# Patient Record
Sex: Female | Born: 1985 | Race: Black or African American | Hispanic: No | Marital: Single | Smoking: Never smoker
Health system: Southern US, Community
[De-identification: ages and names within clinical notes are randomized; demographics above are authoritative.]

## PROBLEM LIST (undated history)

## (undated) DIAGNOSIS — Z789 Other specified health status: Secondary | ICD-10-CM

## (undated) HISTORY — PX: NO PAST SURGERIES: SHX2092

## (undated) HISTORY — DX: Other specified health status: Z78.9

---

## 2008-01-13 ENCOUNTER — Emergency Department (HOSPITAL_COMMUNITY): Admission: EM | Admit: 2008-01-13 | Discharge: 2008-01-13 | Payer: Self-pay | Admitting: Family Medicine

## 2009-03-17 ENCOUNTER — Emergency Department (HOSPITAL_COMMUNITY): Admission: EM | Admit: 2009-03-17 | Discharge: 2009-03-17 | Payer: Self-pay | Admitting: Emergency Medicine

## 2010-11-30 ENCOUNTER — Inpatient Hospital Stay (HOSPITAL_COMMUNITY)
Admission: RE | Admit: 2010-11-30 | Discharge: 2010-11-30 | Disposition: A | Discharge status: Home / Self Care | Payer: Self-pay | Source: Ambulatory Visit | Attending: Emergency Medicine | Admitting: Emergency Medicine

## 2010-11-30 DIAGNOSIS — M799 Soft tissue disorder, unspecified: Secondary | ICD-10-CM

## 2011-02-07 LAB — POCT I-STAT, CHEM 8
BUN: 14 mg/dL (ref 6–23)
Calcium, Ion: 1.2 mmol/L (ref 1.12–1.32)
Chloride: 103 mEq/L (ref 96–112)
Glucose, Bld: 82 mg/dL (ref 70–99)
HCT: 39 % (ref 36.0–46.0)
Potassium: 3.7 mEq/L (ref 3.5–5.1)

## 2011-02-07 LAB — URINALYSIS, ROUTINE W REFLEX MICROSCOPIC
Ketones, ur: 15 mg/dL — AB
Protein, ur: 30 mg/dL — AB
Urobilinogen, UA: 1 mg/dL (ref 0.0–1.0)

## 2011-02-07 LAB — URINE MICROSCOPIC-ADD ON

## 2011-07-24 LAB — POCT RAPID STREP A: Streptococcus, Group A Screen (Direct): NEGATIVE

## 2011-07-24 LAB — POCT PREGNANCY, URINE: Preg Test, Ur: NEGATIVE

## 2011-10-11 ENCOUNTER — Telehealth (HOSPITAL_COMMUNITY): Payer: Self-pay | Admitting: *Deleted
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
## 2011-10-31 NOTE — L&D Delivery Note (Signed)
Delivery Note At 9:42 PM a viable female was delivered via Vaginal, Spontaneous Delivery (Presentation: Right Occiput Anterior).  APGAR: 9, 9; weight: 3745gms.   Placenta status: Abnormal, Manual removal.  Uterine atony and hemorrhage after removal of placenta, that responded well to uterine massage and IV Pitocin. Pathology.  Cord: 3 vessels with the following complications: None.  Cord pH: none  Anesthesia: Epidural  Episiotomy: None Lacerations: None Suture Repair: none Est. Blood Loss (mL): 1000  Mom to postpartum.  Baby to nursery-stable.  HARPER,CHARLES A 07/18/2012, 10:45 PM

## 2012-01-23 LAB — OB RESULTS CONSOLE HIV ANTIBODY (ROUTINE TESTING): HIV: NONREACTIVE

## 2012-01-23 LAB — OB RESULTS CONSOLE ANTIBODY SCREEN: Antibody Screen: NEGATIVE

## 2012-01-23 LAB — OB RESULTS CONSOLE GC/CHLAMYDIA: Gonorrhea: NEGATIVE

## 2012-01-23 LAB — OB RESULTS CONSOLE ABO/RH: RH Type: POSITIVE

## 2012-05-28 ENCOUNTER — Inpatient Hospital Stay (HOSPITAL_COMMUNITY): Admission: AD | Admit: 2012-05-28 | Payer: Self-pay | Source: Ambulatory Visit | Admitting: Obstetrics & Gynecology

## 2012-06-13 LAB — OB RESULTS CONSOLE GBS: GBS: POSITIVE

## 2012-07-10 ENCOUNTER — Other Ambulatory Visit: Payer: Self-pay | Admitting: Obstetrics

## 2012-07-10 ENCOUNTER — Telehealth (HOSPITAL_COMMUNITY): Payer: Self-pay | Admitting: *Deleted

## 2012-07-10 ENCOUNTER — Encounter (HOSPITAL_COMMUNITY): Payer: Self-pay | Admitting: *Deleted

## 2012-07-10 NOTE — Telephone Encounter (Signed)
Preadmission screen  

## 2012-07-16 ENCOUNTER — Encounter (HOSPITAL_COMMUNITY): Payer: Self-pay | Admitting: Anesthesiology

## 2012-07-16 ENCOUNTER — Inpatient Hospital Stay (HOSPITAL_COMMUNITY)
Admission: RE | Admit: 2012-07-16 | Discharge: 2012-07-20 | DRG: 774 | Disposition: A | Discharge status: Home / Self Care | Payer: Medicaid Other | Source: Ambulatory Visit | Attending: Obstetrics | Admitting: Obstetrics

## 2012-07-16 DIAGNOSIS — O43899 Other placental disorders, unspecified trimester: Secondary | ICD-10-CM | POA: Diagnosis present

## 2012-07-16 DIAGNOSIS — O99892 Other specified diseases and conditions complicating childbirth: Secondary | ICD-10-CM | POA: Diagnosis present

## 2012-07-16 DIAGNOSIS — Z22330 Carrier of Group B streptococcus: Secondary | ICD-10-CM

## 2012-07-16 DIAGNOSIS — O480 Post-term pregnancy: Principal | ICD-10-CM | POA: Diagnosis present

## 2012-07-16 LAB — CBC
HCT: 36.4 % (ref 36.0–46.0)
Hemoglobin: 11.8 g/dL — ABNORMAL LOW (ref 12.0–15.0)
MCV: 81.3 fL (ref 78.0–100.0)
RBC: 4.48 MIL/uL (ref 3.87–5.11)
WBC: 9.9 10*3/uL (ref 4.0–10.5)

## 2012-07-16 MED ORDER — LACTATED RINGERS IV SOLN
500.0000 mL | INTRAVENOUS | Status: DC | PRN
Start: 2012-07-16 — End: 2012-07-18

## 2012-07-16 MED ORDER — MISOPROSTOL 25 MCG QUARTER TABLET
25.0000 ug | ORAL_TABLET | ORAL | Status: DC | PRN
  Administered 2012-07-16 – 2012-07-17 (×4): 25 ug via VAGINAL
  Filled 2012-07-16 (×2): qty 0.25
  Filled 2012-07-16: qty 1
  Filled 2012-07-16 (×2): qty 0.25

## 2012-07-16 MED ORDER — OXYTOCIN 40 UNITS IN LACTATED RINGERS INFUSION - SIMPLE MED
62.5000 mL/h | Freq: Once | INTRAVENOUS | Status: DC
  Filled 2012-07-16: qty 1000

## 2012-07-16 MED ORDER — IBUPROFEN 600 MG PO TABS
600.0000 mg | ORAL_TABLET | Freq: Four times a day (QID) | ORAL | Status: DC | PRN
  Administered 2012-07-18: 600 mg via ORAL
  Filled 2012-07-16: qty 1

## 2012-07-16 MED ORDER — ONDANSETRON HCL 4 MG/2ML IJ SOLN: 4.0000 mg | Freq: Four times a day (QID) | INTRAMUSCULAR | Status: DC | PRN

## 2012-07-16 MED ORDER — OXYTOCIN BOLUS FROM INFUSION
500.0000 mL | Freq: Once | INTRAVENOUS | Status: AC
  Administered 2012-07-18: 500 mL via INTRAVENOUS
  Filled 2012-07-16: qty 500

## 2012-07-16 MED ORDER — LACTATED RINGERS IV SOLN
INTRAVENOUS | Status: DC
  Administered 2012-07-16 – 2012-07-18 (×2): via INTRAVENOUS

## 2012-07-16 MED ORDER — ZOLPIDEM TARTRATE 5 MG PO TABS
5.0000 mg | ORAL_TABLET | Freq: Every evening | ORAL | Status: DC | PRN
  Administered 2012-07-17: 5 mg via ORAL
  Filled 2012-07-16: qty 1

## 2012-07-16 MED ORDER — OXYCODONE-ACETAMINOPHEN 5-325 MG PO TABS: 1.0000 | ORAL_TABLET | ORAL | Status: DC | PRN

## 2012-07-16 MED ORDER — PENICILLIN G POTASSIUM 5000000 UNITS IJ SOLR
5.0000 10*6.[IU] | Freq: Once | INTRAVENOUS | Status: DC
  Filled 2012-07-16: qty 5

## 2012-07-16 MED ORDER — CITRIC ACID-SODIUM CITRATE 334-500 MG/5ML PO SOLN: 30.0000 mL | ORAL | Status: DC | PRN

## 2012-07-16 MED ORDER — LIDOCAINE HCL (PF) 1 % IJ SOLN
30.0000 mL | INTRAMUSCULAR | Status: DC | PRN
  Filled 2012-07-16 (×2): qty 30

## 2012-07-16 MED ORDER — ACETAMINOPHEN 325 MG PO TABS
650.0000 mg | ORAL_TABLET | ORAL | Status: DC | PRN
  Administered 2012-07-18 (×2): 650 mg via ORAL
  Filled 2012-07-16 (×2): qty 2

## 2012-07-16 MED ORDER — TERBUTALINE SULFATE 1 MG/ML IJ SOLN: 0.2500 mg | Freq: Once | INTRAMUSCULAR | Status: AC | PRN

## 2012-07-16 NOTE — Progress Notes (Signed)
Called Dr. Clearance Coots and gave report of pt being here for induction.  Orders received for cytotec, and routine labor orders. PCN orders for GBS + start treatment with labor.

## 2012-07-17 ENCOUNTER — Encounter (HOSPITAL_COMMUNITY): Payer: Self-pay | Admitting: Anesthesiology

## 2012-07-17 ENCOUNTER — Inpatient Hospital Stay (HOSPITAL_COMMUNITY): Payer: Medicaid Other | Admitting: Anesthesiology

## 2012-07-17 ENCOUNTER — Encounter (HOSPITAL_COMMUNITY): Payer: Self-pay

## 2012-07-17 DIAGNOSIS — O480 Post-term pregnancy: Secondary | ICD-10-CM | POA: Diagnosis present

## 2012-07-17 MED ORDER — TERBUTALINE SULFATE 1 MG/ML IJ SOLN: 0.2500 mg | Freq: Once | INTRAMUSCULAR | Status: DC | PRN

## 2012-07-17 MED ORDER — PHENYLEPHRINE 40 MCG/ML (10ML) SYRINGE FOR IV PUSH (FOR BLOOD PRESSURE SUPPORT): 80.0000 ug | PREFILLED_SYRINGE | INTRAVENOUS | Status: DC | PRN

## 2012-07-17 MED ORDER — PENICILLIN G POTASSIUM 5000000 UNITS IJ SOLR
2.5000 10*6.[IU] | INTRAVENOUS | Status: DC
  Administered 2012-07-18 (×3): 2.5 10*6.[IU] via INTRAVENOUS
  Filled 2012-07-17 (×8): qty 2.5

## 2012-07-17 MED ORDER — BUTORPHANOL TARTRATE 2 MG/ML IJ SOLN
2.0000 mg | INTRAMUSCULAR | Status: DC | PRN
  Administered 2012-07-17: 2 mg via INTRAVENOUS
  Filled 2012-07-17: qty 2

## 2012-07-17 MED ORDER — LACTATED RINGERS IV SOLN
500.0000 mL | Freq: Once | INTRAVENOUS | Status: AC
  Administered 2012-07-17: 500 mL via INTRAVENOUS

## 2012-07-17 MED ORDER — EPHEDRINE 5 MG/ML INJ
10.0000 mg | INTRAVENOUS | Status: DC | PRN
  Filled 2012-07-17: qty 4

## 2012-07-17 MED ORDER — TERBUTALINE SULFATE 1 MG/ML IJ SOLN: 0.2500 mg | Freq: Once | INTRAMUSCULAR | Status: AC | PRN

## 2012-07-17 MED ORDER — ZOLPIDEM TARTRATE 5 MG PO TABS: 10.0000 mg | ORAL_TABLET | Freq: Once | ORAL | Status: DC

## 2012-07-17 MED ORDER — FENTANYL 2.5 MCG/ML BUPIVACAINE 1/10 % EPIDURAL INFUSION (WH - ANES)
INTRAMUSCULAR | Status: DC | PRN
  Administered 2012-07-17: 14 mL/h via EPIDURAL

## 2012-07-17 MED ORDER — PENICILLIN G POTASSIUM 5000000 UNITS IJ SOLR
5.0000 10*6.[IU] | Freq: Once | INTRAVENOUS | Status: AC
  Administered 2012-07-17: 5 10*6.[IU] via INTRAVENOUS
  Filled 2012-07-17: qty 5

## 2012-07-17 MED ORDER — PHENYLEPHRINE 40 MCG/ML (10ML) SYRINGE FOR IV PUSH (FOR BLOOD PRESSURE SUPPORT)
80.0000 ug | PREFILLED_SYRINGE | INTRAVENOUS | Status: DC | PRN
  Filled 2012-07-17: qty 5

## 2012-07-17 MED ORDER — LIDOCAINE HCL (PF) 1 % IJ SOLN
INTRAMUSCULAR | Status: DC | PRN
  Administered 2012-07-17 (×2): 4 mL

## 2012-07-17 MED ORDER — OXYTOCIN 40 UNITS IN LACTATED RINGERS INFUSION - SIMPLE MED
1.0000 m[IU]/min | INTRAVENOUS | Status: DC
  Administered 2012-07-17: 2 m[IU]/min via INTRAVENOUS

## 2012-07-17 MED ORDER — DIPHENHYDRAMINE HCL 50 MG/ML IJ SOLN
12.5000 mg | INTRAMUSCULAR | Status: AC | PRN
  Administered 2012-07-18 (×3): 12.5 mg via INTRAVENOUS
  Filled 2012-07-17 (×3): qty 1

## 2012-07-17 MED ORDER — FENTANYL 2.5 MCG/ML BUPIVACAINE 1/10 % EPIDURAL INFUSION (WH - ANES)
14.0000 mL/h | INTRAMUSCULAR | Status: DC
  Administered 2012-07-17 – 2012-07-18 (×6): 14 mL/h via EPIDURAL
  Filled 2012-07-17 (×9): qty 60

## 2012-07-17 MED ORDER — EPHEDRINE 5 MG/ML INJ: 10.0000 mg | INTRAVENOUS | Status: DC | PRN

## 2012-07-17 NOTE — Anesthesia Preprocedure Evaluation (Signed)
Anesthesia Evaluation  Patient identified by MRN, date of birth, ID band Patient awake    Reviewed: Allergy & Precautions, H&P , Patient's Chart, lab work & pertinent test results  Airway Mallampati: III TM Distance: >3 FB Neck ROM: full    Dental No notable dental hx. (+) Teeth Intact   Pulmonary neg pulmonary ROS,  breath sounds clear to auscultation  Pulmonary exam normal       Cardiovascular negative cardio ROS  Rhythm:regular Rate:Normal     Neuro/Psych negative neurological ROS  negative psych ROS   GI/Hepatic Neg liver ROS, Medicated and Controlled,  Endo/Other  Morbid obesity  Renal/GU negative Renal ROS  negative genitourinary   Musculoskeletal   Abdominal Normal abdominal exam  (+)   Peds  Hematology negative hematology ROS (+)   Anesthesia Other Findings   Reproductive/Obstetrics (+) Pregnancy                           Anesthesia Physical Anesthesia Plan  ASA: III  Anesthesia Plan: Epidural   Post-op Pain Management:    Induction:   Airway Management Planned:   Additional Equipment:   Intra-op Plan:   Post-operative Plan:   Informed Consent: I have reviewed the patients History and Physical, chart, labs and discussed the procedure including the risks, benefits and alternatives for the proposed anesthesia with the patient or authorized representative who has indicated his/her understanding and acceptance.     Plan Discussed with: Anesthesiologist  Anesthesia Plan Comments:         Anesthesia Quick Evaluation

## 2012-07-17 NOTE — H&P (Signed)
Anita Baldwin is a 26 y.o. female presenting for IOL. Maternal Medical History:  Reason for admission: 26 yo G2 P1  EDC 07-12-12.  Presents for IOL for postdates.  Fetal activity: Perceived fetal activity is normal.   Last perceived fetal movement was within the past hour.    Prenatal complications: no prenatal complications Prenatal Complications - Diabetes: none.    OB History    Grav Para Term Preterm Abortions TAB SAB Ect Mult Living   2 1 1       1      Past Medical History  Diagnosis Date  . No pertinent past medical history    Past Surgical History  Procedure Date  . No past surgeries    Family History: family history includes Cancer in her paternal grandmother; Diabetes in her paternal grandmother and sister; and Hypertension in her maternal grandfather and paternal grandmother. Social History:  reports that she has never smoked. She has never used smokeless tobacco. She reports that she does not drink alcohol or use illicit drugs.   Prenatal Transfer Tool  Maternal Diabetes: No Genetic Screening: Normal Maternal Ultrasounds/Referrals: Normal Fetal Ultrasounds or other Referrals:  None Maternal Substance Abuse:  No Significant Maternal Medications:  Meds include: Other: PNV Significant Maternal Lab Results:  None Other Comments:  None  Review of Systems  All other systems reviewed and are negative.    Dilation: 1.5 Effacement (%): 50 Station: -2 Exam by:: l.poore, rn Blood pressure 120/71, pulse 86, temperature 98.3 F (36.8 C), temperature source Oral, resp. rate 18, height 5\' 3"  (1.6 m), weight 110.678 kg (244 lb). Maternal Exam:  Abdomen: Patient reports no abdominal tenderness. Fetal presentation: vertex     Physical Exam  Nursing note and vitals reviewed. Constitutional: She is oriented to person, place, and time. She appears well-developed and well-nourished.  HENT:  Head: Normocephalic and atraumatic.  Eyes: Conjunctivae normal are normal.  Pupils are equal, round, and reactive to light.  Neck: Normal range of motion. Neck supple.  Cardiovascular: Normal rate and regular rhythm.   Respiratory: Effort normal.  GI: Soft.  Genitourinary: Vagina normal and uterus normal.  Musculoskeletal: Normal range of motion.  Neurological: She is alert and oriented to person, place, and time.  Skin: Skin is warm and dry.  Psychiatric: She has a normal mood and affect. Her behavior is normal. Judgment and thought content normal.    Prenatal labs: ABO, Rh: O/Positive/-- (03/26 0000) Antibody: Negative (03/26 0000) Rubella: Immune (03/26 0000) RPR: NON REACTIVE (09/17 2120)  HBsAg: Negative (03/26 0000)  HIV: Non-reactive (03/26 0000)  GBS: Positive (08/15 0000)   Assessment/Plan: 40.5 weeks.  2 sage IOL.   Johnnisha Forton A 07/17/2012, 4:59 AM

## 2012-07-17 NOTE — Progress Notes (Signed)
Anita Baldwin is a 26 y.o. G2P1001 at [redacted]w[redacted]d by LMP admitted for induction of labor due to Elective at term.  Subjective:   Objective: BP 132/79  Pulse 102  Temp 98.5 F (36.9 C) (Oral)  Resp 18  Ht 5\' 3"  (1.6 m)  Wt 110.678 kg (244 lb)  BMI 43.22 kg/m2  SpO2 100%      FHT:  FHR: 140 bpm, variability: moderate,  accelerations:  Present,  decelerations:  Absent UC:   regular, every 2 minutes SVE:   Dilation: 3 Effacement (%): 60 Station: -1 Exam by:: jackson-moore  Labs: Lab Results  Component Value Date   WBC 9.9 07/16/2012   HGB 11.8* 07/16/2012   HCT 36.4 07/16/2012   MCV 81.3 07/16/2012   PLT 247 07/16/2012    Assessment / Plan: Latent labor  Labor: see above Preeclampsia:  n/a Fetal Wellbeing:  Category I Pain Control:  Epidural I/D:  n/a Anticipated MOD:  NSVD  JACKSON-MOORE,Alferd Obryant A 07/17/2012, 8:07 PM

## 2012-07-17 NOTE — Progress Notes (Signed)
Anita Baldwin is a 26 y.o. G2P1001 at [redacted]w[redacted]d by LMP admitted for induction of labor due to Post dates. Due date 07-12-12.  Subjective:   Objective: BP 120/71  Pulse 86  Temp 98.3 F (36.8 C) (Oral)  Resp 18  Ht 5\' 3"  (1.6 m)  Wt 110.678 kg (244 lb)  BMI 43.22 kg/m2      FHT:  FHR: 150 bpm, variability: moderate,  accelerations:  Present,  decelerations:  Absent UC:   Irregular SVE:   Dilation: 1.5 Effacement (%): 50 Station: -2 Exam by:: l.poore, rn  Labs: Lab Results  Component Value Date   WBC 9.9 07/16/2012   HGB 11.8* 07/16/2012   HCT 36.4 07/16/2012   MCV 81.3 07/16/2012   PLT 247 07/16/2012    Assessment / Plan: Induction of labor due to postterm,  progressing well on pitocin  Labor: Latent phase Preeclampsia:  n/a Fetal Wellbeing:  Category I Pain Control:  Labor support without medications I/D:  n/a Anticipated MOD:  NSVD  Anita Baldwin A 07/17/2012, 5:09 AM

## 2012-07-17 NOTE — Anesthesia Procedure Notes (Signed)
Epidural Patient location during procedure: OB Start time: 07/17/2012 3:11 PM  Staffing Anesthesiologist: Malavika Lira A. Performed by: anesthesiologist   Preanesthetic Checklist Completed: patient identified, site marked, surgical consent, pre-op evaluation, timeout performed, IV checked, risks and benefits discussed and monitors and equipment checked  Epidural Patient position: sitting Prep: site prepped and draped and DuraPrep Patient monitoring: continuous pulse ox and blood pressure Approach: midline Injection technique: LOR air  Needle:  Needle type: Tuohy  Needle gauge: 17 G Needle length: 9 cm and 9 Needle insertion depth: 6 cm Catheter type: closed end flexible Catheter size: 19 Gauge Catheter at skin depth: 11 cm Test dose: negative and Other  Assessment Events: blood not aspirated, injection not painful, no injection resistance, negative IV test and no paresthesia  Additional Notes Patient identified. Risks and benefits discussed including failed block, incomplete  Pain control, post dural puncture headache, nerve damage, paralysis, blood pressure Changes, nausea, vomiting, reactions to medications-both toxic and allergic and post Partum back pain. All questions were answered. Patient expressed understanding and wished to proceed. Sterile technique was used throughout procedure. Epidural site was Dressed with sterile barrier dressing. No paresthesias, signs of intravascular injection Or signs of intrathecal spread were encountered.  Patient was more comfortable after the epidural was dosed. Please see RN's note for documentation of vital signs and FHR which are stable.

## 2012-07-18 ENCOUNTER — Encounter (HOSPITAL_COMMUNITY): Payer: Self-pay

## 2012-07-18 LAB — TYPE AND SCREEN
ABO/RH(D): O POS
Antibody Screen: NEGATIVE

## 2012-07-18 LAB — ABO/RH: ABO/RH(D): O POS

## 2012-07-18 MED ORDER — DIBUCAINE 1 % RE OINT: 1.0000 "application " | TOPICAL_OINTMENT | RECTAL | Status: DC | PRN

## 2012-07-18 MED ORDER — OXYTOCIN 40 UNITS IN LACTATED RINGERS INFUSION - SIMPLE MED: 62.5000 mL/h | INTRAVENOUS | Status: DC

## 2012-07-18 MED ORDER — OXYTOCIN 40 UNITS IN LACTATED RINGERS INFUSION - SIMPLE MED
62.5000 mL/h | INTRAVENOUS | Status: DC | PRN
  Administered 2012-07-18: 62.5 mL/h via INTRAVENOUS

## 2012-07-18 MED ORDER — LANOLIN HYDROUS EX OINT: TOPICAL_OINTMENT | CUTANEOUS | Status: DC | PRN

## 2012-07-18 MED ORDER — NALBUPHINE SYRINGE 5 MG/0.5 ML
10.0000 mg | INJECTION | Freq: Once | INTRAMUSCULAR | Status: AC
  Administered 2012-07-18: 10 mg via INTRAVENOUS
  Filled 2012-07-18: qty 1

## 2012-07-18 MED ORDER — DIPHENHYDRAMINE HCL 25 MG PO CAPS: 25.0000 mg | ORAL_CAPSULE | Freq: Four times a day (QID) | ORAL | Status: DC | PRN

## 2012-07-18 MED ORDER — ONDANSETRON HCL 4 MG PO TABS: 4.0000 mg | ORAL_TABLET | ORAL | Status: DC | PRN

## 2012-07-18 MED ORDER — SENNOSIDES-DOCUSATE SODIUM 8.6-50 MG PO TABS
2.0000 | ORAL_TABLET | Freq: Every day | ORAL | Status: DC
  Administered 2012-07-19: 2 via ORAL

## 2012-07-18 MED ORDER — WITCH HAZEL-GLYCERIN EX PADS: 1.0000 "application " | MEDICATED_PAD | CUTANEOUS | Status: DC | PRN

## 2012-07-18 MED ORDER — TETANUS-DIPHTH-ACELL PERTUSSIS 5-2.5-18.5 LF-MCG/0.5 IM SUSP: 0.5000 mL | Freq: Once | INTRAMUSCULAR | Status: DC

## 2012-07-18 MED ORDER — OXYTOCIN 40 UNITS IN LACTATED RINGERS INFUSION - SIMPLE MED
INTRAVENOUS | Status: AC
  Filled 2012-07-18: qty 1000

## 2012-07-18 MED ORDER — ONDANSETRON HCL 4 MG/2ML IJ SOLN: 4.0000 mg | INTRAMUSCULAR | Status: DC | PRN

## 2012-07-18 MED ORDER — OXYCODONE-ACETAMINOPHEN 5-325 MG PO TABS: 1.0000 | ORAL_TABLET | ORAL | Status: DC | PRN

## 2012-07-18 MED ORDER — ZOLPIDEM TARTRATE 5 MG PO TABS: 5.0000 mg | ORAL_TABLET | Freq: Every evening | ORAL | Status: DC | PRN

## 2012-07-18 MED ORDER — METHYLERGONOVINE MALEATE 0.2 MG/ML IJ SOLN: 0.2000 mg | INTRAMUSCULAR | Status: DC | PRN

## 2012-07-18 MED ORDER — SIMETHICONE 80 MG PO CHEW: 80.0000 mg | CHEWABLE_TABLET | ORAL | Status: DC | PRN

## 2012-07-18 MED ORDER — PRENATAL MULTIVITAMIN CH
1.0000 | ORAL_TABLET | Freq: Every day | ORAL | Status: DC
  Administered 2012-07-19 – 2012-07-20 (×2): 1 via ORAL
  Filled 2012-07-18 (×2): qty 1

## 2012-07-18 MED ORDER — METHYLERGONOVINE MALEATE 0.2 MG PO TABS: 0.2000 mg | ORAL_TABLET | ORAL | Status: DC | PRN

## 2012-07-18 MED ORDER — IBUPROFEN 600 MG PO TABS
600.0000 mg | ORAL_TABLET | Freq: Four times a day (QID) | ORAL | Status: DC
  Administered 2012-07-19 – 2012-07-20 (×7): 600 mg via ORAL
  Filled 2012-07-18 (×7): qty 1

## 2012-07-18 MED ORDER — DEXTROSE 5 % IV SOLN
2.0000 g | Freq: Four times a day (QID) | INTRAVENOUS | Status: DC
  Administered 2012-07-18: 2 g via INTRAVENOUS
  Filled 2012-07-18 (×2): qty 2

## 2012-07-18 MED ORDER — BENZOCAINE-MENTHOL 20-0.5 % EX AERO
1.0000 "application " | INHALATION_SPRAY | CUTANEOUS | Status: DC | PRN
  Administered 2012-07-19: 1 via TOPICAL
  Filled 2012-07-18: qty 56

## 2012-07-19 LAB — CBC
MCH: 26.8 pg (ref 26.0–34.0)
Platelets: 222 10*3/uL (ref 150–400)
RBC: 4.11 MIL/uL (ref 3.87–5.11)
WBC: 17.4 10*3/uL — ABNORMAL HIGH (ref 4.0–10.5)

## 2012-07-19 MED ORDER — INFLUENZA VIRUS VACC SPLIT PF IM SUSP: 0.5000 mL | INTRAMUSCULAR | Status: DC

## 2012-07-19 NOTE — Anesthesia Postprocedure Evaluation (Signed)
  Anesthesia Post-op Note  Patient: Anita Baldwin  Procedure(s) Performed: * No procedures listed *  Patient Location: Mother/Baby  Anesthesia Type: Epidural  Level of Consciousness: awake  Airway and Oxygen Therapy: Patient Spontanous Breathing  Post-op Pain: mild  Post-op Assessment: Patient's Cardiovascular Status Stable and Respiratory Function Stable  Post-op Vital Signs: stable  Complications: No apparent anesthesia complications

## 2012-07-19 NOTE — Progress Notes (Signed)
Post Partum Day 1 Subjective: no complaints  Objective: Blood pressure 110/68, pulse 94, temperature 97.8 F (36.6 C), temperature source Oral, resp. rate 18, height 5\' 3"  (1.6 m), weight 110.678 kg (244 lb), SpO2 100.00%, unknown if currently breastfeeding.  Physical Exam:  General: alert and no distress Lochia: appropriate Uterine Fundus: firm Incision: none DVT Evaluation: No evidence of DVT seen on physical exam.   Basename 07/19/12 0535 07/16/12 2120  HGB 11.0* 11.8*  HCT 33.1* 36.4    Assessment/Plan: Plan for discharge tomorrow   LOS: 3 days   Wilkin Lippy A 07/19/2012, 11:44 AM

## 2012-07-19 NOTE — Progress Notes (Signed)
UR chart review completed.  

## 2012-07-20 NOTE — Discharge Summary (Signed)
Obstetric Discharge Summary Reason for Admission: onset of labor Prenatal Procedures: none Intrapartum Procedures: spontaneous vaginal delivery Postpartum Procedures: none Complications-Operative and Postpartum: none Hemoglobin  Date Value Range Status  07/19/2012 11.0* 12.0 - 15.0 g/dL Final     HCT  Date Value Range Status  07/19/2012 33.1* 36.0 - 46.0 % Final    Physical Exam:  General: alert Lochia: appropriate Uterine Fundus: firm Incision: healing well DVT Evaluation: No evidence of DVT seen on physical exam.  Discharge Diagnoses: Term Pregnancy-delivered  Discharge Information: Date: 07/20/2012 Activity: pelvic rest Diet: routine Medications: Percocet Condition: stable Instructions: refer to practice specific booklet Discharge to: home Follow-up Information    Call in 6 weeks to follow up.         Newborn Data: Live born female  Birth Weight: 8 lb 4.1 oz (3745 g) APGAR: 9, 9  Home with mother.  Anita Baldwin A 07/20/2012, 6:22 AM

## 2014-02-09 ENCOUNTER — Emergency Department (HOSPITAL_COMMUNITY)
Admission: EM | Admit: 2014-02-09 | Discharge: 2014-02-09 | Disposition: A | Discharge status: Home / Self Care | Payer: No Typology Code available for payment source | Source: Home / Self Care | Attending: Emergency Medicine | Admitting: Emergency Medicine

## 2014-02-09 ENCOUNTER — Encounter (HOSPITAL_COMMUNITY): Payer: Self-pay | Admitting: Emergency Medicine

## 2014-02-09 ENCOUNTER — Ambulatory Visit: Payer: Self-pay

## 2014-02-09 ENCOUNTER — Emergency Department (HOSPITAL_COMMUNITY)
Admission: EM | Admit: 2014-02-09 | Discharge: 2014-02-09 | Disposition: A | Discharge status: Home / Self Care | Payer: No Typology Code available for payment source | Source: Home / Self Care

## 2014-02-09 DIAGNOSIS — J301 Allergic rhinitis due to pollen: Secondary | ICD-10-CM

## 2014-02-09 DIAGNOSIS — J309 Allergic rhinitis, unspecified: Secondary | ICD-10-CM

## 2014-02-09 MED ORDER — OLOPATADINE HCL 0.2 % OP SOLN: OPHTHALMIC | Status: DC

## 2014-02-09 MED ORDER — FEXOFENADINE-PSEUDOEPHED ER 60-120 MG PO TB12: 1.0000 | ORAL_TABLET | Freq: Two times a day (BID) | ORAL | Status: DC

## 2014-02-09 MED ORDER — FLUTICASONE PROPIONATE 50 MCG/ACT NA SUSP: 2.0000 | Freq: Two times a day (BID) | NASAL | Status: DC

## 2014-02-09 MED ORDER — PREDNISONE (PAK) 5 MG PO TABS: ORAL_TABLET | ORAL | Status: DC

## 2014-02-09 NOTE — ED Provider Notes (Signed)
CSN: [id number]     Arrival date & time 02/09/14  0909 History   First MD Initiated Contact with Patient 02/09/14 1032     Chief Complaint  Patient presents with  . Allergies   (Consider location/radiation/quality/duration/timing/severity/associated sxs/prior Treatment) HPI Comments: 28 year old female presents complaining of itchy, burning, watery eyes, itchy throat, nasal congestion, rhinorrhea. Her symptoms have been going on now for about one week and are becoming unbearable. She has been taking over-the-counter allergy relief medication with some relief. She has never had this before and has no history of seasonal allergies. She denies fever, chills, NVD, cough. No recent travel or sick contacts.   Past Medical History  Diagnosis Date  . No pertinent past medical history    Past Surgical History  Procedure Laterality Date  . No past surgeries     Family History  Problem Relation Age of Onset  . Diabetes Sister   . Hypertension Maternal Grandfather   . Hypertension Paternal Grandmother   . Cancer Paternal Grandmother     breast  . Diabetes Paternal Grandmother    History  Substance Use Topics  . Smoking status: Never Smoker   . Smokeless tobacco: Never Used  . Alcohol Use: No   OB History   Grav Para Term Preterm Abortions TAB SAB Ect Mult Living   2 2 2       2      Review of Systems  Constitutional: Negative for fever and chills.  HENT: Positive for congestion, sinus pressure, sneezing and sore throat (itching). Negative for ear pain.   Eyes: Positive for discharge and itching.  Respiratory: Negative for cough and wheezing.   All other systems reviewed and are negative.   Allergies  Review of patient's allergies indicates no known allergies.  Home Medications   Current Outpatient Rx  Name  Route  Sig  Dispense  Refill  . fexofenadine-pseudoephedrine (ALLEGRA-D) 60-120 MG per tablet   Oral   Take 1 tablet by mouth every 12 (twelve) hours.   30 tablet    0   . fluticasone (FLONASE) 50 MCG/ACT nasal spray   Each Nare   Place 2 sprays into both nostrils 2 (two) times daily. Decrease to 2 sprays/nostril daily after 5 days   16 g   2   . Olopatadine HCl (PATADAY) 0.2 % SOLN      1 drop per eye once daily as needed for redness, itching, or irritation   2.5 mL   0   . predniSONE (STERAPRED UNI-PAK) 5 MG TABS tablet      Use as directed on package instructions   21 tablet   0    BP 152/101  Pulse 82  Temp(Src) 98.6 F (37 C) (Oral)  Resp 12  SpO2 100%  LMP 11/30/2013  Breastfeeding? No Physical Exam  Nursing note and vitals reviewed. Constitutional: She is oriented to person, place, and time. Vital signs are normal. She appears well-developed and well-nourished. No distress.  HENT:  Head: Normocephalic and atraumatic.  Nose: Mucosal edema and rhinorrhea present. Right sinus exhibits no maxillary sinus tenderness and no frontal sinus tenderness. Left sinus exhibits no maxillary sinus tenderness and no frontal sinus tenderness.  Mouth/Throat: Uvula is midline, oropharynx is clear and moist and mucous membranes are normal.  Eyes: Right eye exhibits discharge. Left eye exhibits discharge.  Eyes glassy with tearing   Pulmonary/Chest: Effort normal and breath sounds normal. No respiratory distress.  Lymphadenopathy:    She has no cervical adenopathy.  Neurological: She is alert and oriented to person, place, and time. She has normal strength. Coordination normal.  Skin: Skin is warm and dry. No rash noted. She is not diaphoretic.  Psychiatric: She has a normal mood and affect. Judgment normal.    ED Course  Procedures (including critical care time) Labs Review Labs Reviewed - No data to display Imaging Review No results found.   MDM   1. Hay fever    F/u if not improving    Meds ordered this encounter  Medications  . predniSONE (STERAPRED UNI-PAK) 5 MG TABS tablet    Sig: Use as directed on package instructions     Dispense:  21 tablet    Refill:  0    Order Specific Question:  Supervising Provider    Answer:  Lorenz CoasterKELLER, DAVID C [id number]]  . fluticasone (FLONASE) 50 MCG/ACT nasal spray    Sig: Place 2 sprays into both nostrils 2 (two) times daily. Decrease to 2 sprays/nostril daily after 5 days    Dispense:  16 g    Refill:  2    Order Specific Question:  Supervising Provider    Answer:  Lorenz CoasterKELLER, DAVID C [id number]]  . fexofenadine-pseudoephedrine (ALLEGRA-D) 60-120 MG per tablet    Sig: Take 1 tablet by mouth every 12 (twelve) hours.    Dispense:  30 tablet    Refill:  0    Order Specific Question:  Supervising Provider    Answer:  Lorenz CoasterKELLER, DAVID C [id number]]  . Olopatadine HCl (PATADAY) 0.2 % SOLN    Sig: 1 drop per eye once daily as needed for redness, itching, or irritation    Dispense:  2.5 mL    Refill:  0    Order Specific Question:  Supervising Provider    Answer:  Lorenz CoasterKELLER, DAVID C [[id number]]       Anita GoodZachary H Huxley Vanwagoner, PA-C 02/09/14 1054

## 2014-02-09 NOTE — ED Provider Notes (Signed)
Medical screening examination/treatment/procedure(s) were performed by non-physician practitioner and as supervising physician I was immediately available for consultation/collaboration.  Lyndon Chenoweth, M.D.  Calyn Sivils C Shaunte Tuft, MD 02/09/14 1629 

## 2014-02-09 NOTE — Discharge Instructions (Signed)
Hay Fever Hay fever is an allergic reaction to particles in the air. It cannot be passed from person to person. It cannot be cured, but it can be controlled. CAUSES  Hay fever is caused by something that triggers an allergic reaction (allergens). The following are examples of allergens:  Ragweed.  Feathers.  Animal dander.  Grass and tree pollens.  Cigarette smoke.  House dust.  Pollution. SYMPTOMS   Sneezing.  Runny or stuffy nose.  Tearing eyes.  Itchy eyes, nose, mouth, throat, skin, or other area.  Sore throat.  Headache.  Decreased sense of smell or taste. DIAGNOSIS Your caregiver will perform a physical exam and ask questions about the symptoms you are having.Allergy testing may be done to determine exactly what triggers your hay fever.  TREATMENT   Over-the-counter medicines may help symptoms. These include:  Antihistamines.  Decongestants. These may help with nasal congestion.  Your caregiver may prescribe medicines if over-the-counter medicines do not work.  Some people benefit from allergy shots when other medicines are not helpful. HOME CARE INSTRUCTIONS   Avoid the allergen that is causing your symptoms, if possible.  Take all medicine as told by your caregiver. SEEK MEDICAL CARE IF:   You have severe allergy symptoms and your current medicines are not helping.  Your treatment was working at one time, but you are now experiencing symptoms.  You have sinus congestion and pressure.  You develop a fever or headache.  You have thick nasal discharge.  You have asthma and have a worsening cough and wheezing. SEEK IMMEDIATE MEDICAL CARE IF:   You have swelling of your tongue or lips.  You have trouble breathing.  You feel lightheaded or like you are going to faint.  You have cold sweats.  You have a fever. Document Released: 10/16/2005 Document Revised: 01/08/2012 Document Reviewed: 01/11/2011 [employer] Patient Information 2014  [employer]  

## 2014-02-09 NOTE — ED Notes (Signed)
C/o  Itchy watery eyes.  Burning sensation.  Sinus pressure and pain.  X 1 wk.  Denies fever, n/v/d.  Mild relief with otc meds

## 2014-08-31 ENCOUNTER — Encounter (HOSPITAL_COMMUNITY): Payer: Self-pay | Admitting: Emergency Medicine

## 2015-06-08 ENCOUNTER — Telehealth: Payer: Self-pay | Admitting: *Deleted

## 2015-06-08 NOTE — Telephone Encounter (Signed)
Patient is interested in having her Nexplanon removed. Patient had her Nexplanon placed by Dr. Clearance Coots 08/2012. Patient currently has no insurance and has been made aware that the cash pay price is $170 and is due on the day of the appointment. Patient has been scheduled for 06/11/15.

## 2015-06-11 ENCOUNTER — Ambulatory Visit (INDEPENDENT_AMBULATORY_CARE_PROVIDER_SITE_OTHER): Payer: No Typology Code available for payment source | Admitting: Obstetrics

## 2015-06-11 VITALS — BP 149/97 | HR 87 | Temp 98.1°F

## 2015-06-11 DIAGNOSIS — Z30011 Encounter for initial prescription of contraceptive pills: Secondary | ICD-10-CM

## 2015-06-11 DIAGNOSIS — Z3046 Encounter for surveillance of implantable subdermal contraceptive: Secondary | ICD-10-CM

## 2015-06-11 DIAGNOSIS — Z308 Encounter for other contraceptive management: Secondary | ICD-10-CM | POA: Diagnosis not present

## 2015-06-11 MED ORDER — NORGESTIMATE-ETH ESTRADIOL 0.25-35 MG-MCG PO TABS: 1.0000 | ORAL_TABLET | Freq: Every day | ORAL | Status: DC

## 2015-06-11 NOTE — Progress Notes (Signed)
NEXPLANON REMOVAL NOTE  Date of LMP:   unknown  Contraception used: *Nexplanon   Indications:  The patient desires removal of Nexplanon.  She understands risks, benefits, and alternatives to Nexplanon and would like to proceed.  Anesthesia:   Lidocaine 1% plain.  Procedure:  A time-out was performed confirming the procedure and the patient's allergy status.  Complications: None                      The rod was palpated and the area was sterilely prepped.  The area beneath the distal tip was anesthetized with 1% xylocaine and the skin incised                       Over the tip and the tip was exposed, grasped with forcep and removed intact.  A single suture of 4-0 Vicryl was used to close incision.  Steri strip                       And a bandage applied and the arm was wrapped with gauze bandage.  The patient tolerated well.  Instructions:  The patient was instructed to remove the dressing in 24 hours and that some bruising is to be expected.  She was advised to use over the counter analgesics as needed for any pain at the site.  She is to keep the area dry for 24 hours and to call if her hand or arm becomes cold, numb, or blue.  Return visit:  Return in 2 weeks 

## 2015-06-15 ENCOUNTER — Telehealth: Payer: Self-pay | Admitting: *Deleted

## 2015-06-15 NOTE — Telephone Encounter (Signed)
Patient states she was recently given a prescription for a birth control pill after the removal of her Nexplanon. Patient states she is having side effects like nausea and vomiting. Patient states she has tried taking it with food and different times of the day. Patient was given Sprintec. Patient wants to try a lower dose pill.

## 2015-06-24 ENCOUNTER — Encounter: Payer: Self-pay | Admitting: Obstetrics

## 2015-06-24 ENCOUNTER — Ambulatory Visit (INDEPENDENT_AMBULATORY_CARE_PROVIDER_SITE_OTHER): Payer: No Typology Code available for payment source | Admitting: Obstetrics

## 2015-06-24 VITALS — BP 144/97 | HR 112 | Temp 97.6°F | Ht 64.0 in | Wt 204.3 lb

## 2015-06-24 DIAGNOSIS — Z3046 Encounter for surveillance of implantable subdermal contraceptive: Secondary | ICD-10-CM

## 2015-06-24 DIAGNOSIS — Z3041 Encounter for surveillance of contraceptive pills: Secondary | ICD-10-CM

## 2015-06-24 DIAGNOSIS — Z30011 Encounter for initial prescription of contraceptive pills: Secondary | ICD-10-CM

## 2015-06-24 DIAGNOSIS — Z308 Encounter for other contraceptive management: Secondary | ICD-10-CM

## 2015-06-24 MED ORDER — NORETHIN ACE-ETH ESTRAD-FE 1-20 MG-MCG PO TABS: 1.0000 | ORAL_TABLET | Freq: Every day | ORAL | Status: DC

## 2015-06-24 NOTE — Progress Notes (Signed)
Subjective:    Anita Baldwin is a 29 y.o. female who presents for follow up after 2 week follow up after Nexplanon removal and contraception counseling. The patient has no complaints today.  Wants to change OCP's because of side effects. The patient is sexually active. Pertinent past medical history: none.  The information documented in the HPI was reviewed and verified.  Menstrual History: OB History    Gravida Para Term Preterm AB TAB SAB Ectopic Multiple Living   Patient's last menstrual period was 04/30/2015 (approximate).   There are no active problems to display for this patient.  Past Medical History  Diagnosis Date  . No pertinent past medical history     Past Surgical History  Procedure Laterality Date  . No past surgeries       Current outpatient prescriptions:  .  fexofenadine-pseudoephedrine (ALLEGRA-D) 60-120 MG per tablet, Take 1 tablet by mouth every 12 (twelve) hours. (Patient not taking: Reported on 06/11/2015), Disp: 30 tablet, Rfl: 0 .  fluticasone (FLONASE) 50 MCG/ACT nasal spray, Place 2 sprays into both nostrils 2 (two) times daily. Decrease to 2 sprays/nostril daily after 5 days (Patient not taking: Reported on 06/11/2015), Disp: 16 g, Rfl: 2 .  norethindrone-ethinyl estradiol (LOESTRIN FE 1/20) 1-20 MG-MCG tablet, Take 1 tablet by mouth daily., Disp: 1 Package, Rfl: 11 .  Olopatadine HCl (PATADAY) 0.2 % SOLN, 1 drop per eye once daily as needed for redness, itching, or irritation (Patient not taking: Reported on 06/11/2015), Disp: 2.5 mL, Rfl: 0 .  predniSONE (STERAPRED UNI-PAK) 5 MG TABS tablet, Use as directed on package instructions (Patient not taking: Reported on 06/11/2015), Disp: 21 tablet, Rfl: 0 No Known Allergies  Social History  Substance Use Topics  . Smoking status: Never Smoker   . Smokeless tobacco: Never Used  . Alcohol Use: No    Family History  Problem Relation Age of Onset  . Diabetes Sister   . Hypertension  Maternal Grandfather   . Hypertension Paternal Grandmother   . Cancer Paternal Grandmother     breast  . Diabetes Paternal Grandmother        Review of Systems Constitutional: negative for weight loss Genitourinary:negative for abnormal menstrual periods and vaginal discharge   Objective:   BP 144/97 mmHg  Pulse 112  Temp(Src) 97.6 F (36.4 C)  Ht  (1.626 m)  Wt 204 lb 4.8 oz (92.67 kg)  BMI 35.05 kg/m2  LMP 04/30/2015 (Approximate)   PE:      Left arm removal site:  Incision C, D, I and non tender.   Lab Review Urine pregnancy test Labs reviewed no Radiologic studies reviewed no    Assessment:    29 y.o., discontinuing Nexplanon, no contraindications.    Wants a lower dose OCP.  Plan:   D/C Sprintec 28 Loestrin 1/20 Fe Rx  All questions answered. Contraception: OCP (estrogen/progesterone). Discussed healthy lifestyle modifications. Follow up in 2 months.  Pap Smear. Meds ordered this encounter  Medications  . norethindrone-ethinyl estradiol (LOESTRIN FE 1/20) 1-20 MG-MCG tablet    Sig: Take 1 tablet by mouth daily.    Dispense:  1 Package    Refill:  11   No orders of the defined types were placed in this encounter.

## 2015-06-25 NOTE — Telephone Encounter (Signed)
Patient in office 06-24-15

## 2015-08-26 ENCOUNTER — Ambulatory Visit: Payer: No Typology Code available for payment source | Admitting: Obstetrics

## 2015-09-02 ENCOUNTER — Ambulatory Visit: Payer: No Typology Code available for payment source | Admitting: Obstetrics

## 2015-09-28 ENCOUNTER — Ambulatory Visit (INDEPENDENT_AMBULATORY_CARE_PROVIDER_SITE_OTHER): Payer: Self-pay | Admitting: Obstetrics

## 2015-09-28 ENCOUNTER — Encounter: Payer: Self-pay | Admitting: Obstetrics

## 2015-09-28 VITALS — BP 137/88 | HR 85 | Wt 196.0 lb

## 2015-09-28 DIAGNOSIS — Z30011 Encounter for initial prescription of contraceptive pills: Secondary | ICD-10-CM

## 2015-09-28 DIAGNOSIS — Z01419 Encounter for gynecological examination (general) (routine) without abnormal findings: Secondary | ICD-10-CM

## 2015-09-28 DIAGNOSIS — R350 Frequency of micturition: Secondary | ICD-10-CM

## 2015-09-28 LAB — POCT URINALYSIS DIPSTICK
BILIRUBIN UA: NEGATIVE
Blood, UA: NEGATIVE
Glucose, UA: NEGATIVE
KETONES UA: NEGATIVE
Nitrite, UA: NEGATIVE
PH UA: 5
SPEC GRAV UA: 1.02
Urobilinogen, UA: NEGATIVE

## 2015-09-28 NOTE — Progress Notes (Signed)
Subjective:        Anita Baldwin is a 29 y.o. female here for a routine exam.  Current complaints: none.    Personal health questionnaire:  Is patient [name], have a family history of breast and/or ovarian cancer: no Is there a family history of uterine cancer diagnosed at age < 48, gastrointestinal cancer, urinary tract cancer, family member who is a Personnel officer syndrome-associated carrier: no Is the patient overweight and hypertensive, family history of diabetes, personal history of gestational diabetes, preeclampsia or PCOS: no Is patient over 107, have PCOS,  family history of premature CHD under age 79, diabetes, smoke, have hypertension or peripheral artery disease:  no At any time, has a partner hit, kicked or otherwise hurt or frightened you?: no Over the past 2 weeks, have you felt down, depressed or hopeless?: no Over the past 2 weeks, have you felt little interest or pleasure in doing things?:no   Gynecologic History Patient's last menstrual period was 09/14/2015. Contraception: OCP (estrogen/progesterone) Last Pap: >2 yrs.. Results were: normal Last mammogram: n/a. Results were: n/a  Obstetric History OB History  Gravida Para Term Preterm AB SAB TAB Ectopic Multiple Living  # Outcome Date GA Lbr Len/2nd Weight Sex Delivery Anes PTL Lv  2 Term 07/18/12 [redacted]w[redacted]d 24:14 / 01:28 8 lb 4.1 oz (3.745 kg) M Vag-Spont EPI  Y     Comments: WDL  1 Term 2006 [redacted]w[redacted]d 48:00 7 lb 6 oz (3.345 kg) M Vag-Spont EPI  Y      Past Medical History  Diagnosis Date  . No pertinent past medical history     Past Surgical History  Procedure Laterality Date  . No past surgeries       Current outpatient prescriptions:  .  norethindrone-ethinyl estradiol (LOESTRIN FE 1/20) 1-20 MG-MCG tablet, Take 1 tablet by mouth daily., Disp: 1 Package, Rfl: 11 .  fexofenadine-pseudoephedrine (ALLEGRA-D) 60-120 MG per tablet, Take 1 tablet by mouth every 12 (twelve) hours. (Patient  not taking: Reported on 06/11/2015), Disp: 30 tablet, Rfl: 0 .  fluticasone (FLONASE) 50 MCG/ACT nasal spray, Place 2 sprays into both nostrils 2 (two) times daily. Decrease to 2 sprays/nostril daily after 5 days (Patient not taking: Reported on 06/11/2015), Disp: 16 g, Rfl: 2 .  Olopatadine HCl (PATADAY) 0.2 % SOLN, 1 drop per eye once daily as needed for redness, itching, or irritation (Patient not taking: Reported on 06/11/2015), Disp: 2.5 mL, Rfl: 0 .  predniSONE (STERAPRED UNI-PAK) 5 MG TABS tablet, Use as directed on package instructions (Patient not taking: Reported on 06/11/2015), Disp: 21 tablet, Rfl: 0 No Known Allergies  Social History  Substance Use Topics  . Smoking status: Never Smoker   . Smokeless tobacco: Never Used  . Alcohol Use: No    Family History  Problem Relation Age of Onset  . Diabetes Sister   . Hypertension Maternal Grandfather   . Hypertension Paternal Grandmother   . Cancer Paternal Grandmother     breast  . Diabetes Paternal Grandmother       Review of Systems  Constitutional: negative for fatigue and weight loss Respiratory: negative for cough and wheezing Cardiovascular: negative for chest pain, fatigue and palpitations Gastrointestinal: negative for abdominal pain and change in bowel habits Musculoskeletal:negative for myalgias Neurological: negative for gait problems and tremors Behavioral/Psych: negative for abusive relationship, depression Endocrine: negative for temperature intolerance   Genitourinary:negative for abnormal menstrual periods,  genital lesions, hot flashes, sexual problems and vaginal discharge Integument/breast: negative for breast lump, breast tenderness, nipple discharge and skin lesion(s)    Objective:       BP 137/88 mmHg  Pulse 85  Wt 196 lb (88.905 kg)  LMP 09/14/2015 General:   alert  Skin:   no rash or abnormalities  Lungs:   clear to auscultation bilaterally  Heart:   regular rate and rhythm, S1, S2 normal, no  murmur, click, rub or gallop  Breasts:   normal without suspicious masses, skin or nipple changes or axillary nodes  Abdomen:  normal findings: no organomegaly, soft, non-tender and no hernia  Pelvis:  External genitalia: normal general appearance Urinary system: urethral meatus normal and bladder without fullness, nontender Vaginal: normal without tenderness, induration or masses Cervix: normal appearance Adnexa: normal bimanual exam Uterus: anteverted and non-tender, normal size   Lab Review Urine pregnancy test Labs reviewed yes Radiologic studies reviewed no    Assessment:    Healthy female exam.    Contraceptive mamagement.  Pleased with Loestrin Fe 20   Plan:     Education reviewed: low fat, low cholesterol diet. Contraception: OCP (estrogen/progesterone).  F/U 1 year   No orders of the defined types were placed in this encounter.   Orders Placed This Encounter  Procedures  . POCT urinalysis dipstick

## 2015-10-12 LAB — PAP IG (IMAGE GUIDED)

## 2015-11-18 ENCOUNTER — Telehealth: Payer: Self-pay | Admitting: *Deleted

## 2015-11-18 NOTE — Telephone Encounter (Signed)
Patient states she is still having the urinary symptoms that she was experiencing in November and would like something sent to the pharmacy.Patient states he told he to call if she needed. Told patient I would let Dr Clearance Coots know and we would take care of that for her.

## 2015-11-19 ENCOUNTER — Other Ambulatory Visit: Payer: Self-pay | Admitting: Obstetrics

## 2015-11-19 DIAGNOSIS — N390 Urinary tract infection, site not specified: Secondary | ICD-10-CM

## 2015-11-19 MED ORDER — NITROFURANTOIN MONOHYD MACRO 100 MG PO CAPS: 100.0000 mg | ORAL_CAPSULE | Freq: Two times a day (BID) | ORAL | Status: DC

## 2016-05-26 ENCOUNTER — Other Ambulatory Visit: Payer: Self-pay | Admitting: Obstetrics

## 2016-05-26 DIAGNOSIS — Z3041 Encounter for surveillance of contraceptive pills: Secondary | ICD-10-CM

## 2016-10-03 ENCOUNTER — Encounter: Payer: Self-pay | Admitting: Obstetrics

## 2016-10-03 ENCOUNTER — Ambulatory Visit (INDEPENDENT_AMBULATORY_CARE_PROVIDER_SITE_OTHER): Payer: Self-pay | Admitting: Obstetrics

## 2016-10-03 VITALS — BP 159/106 | HR 86 | Temp 97.6°F | Wt 207.1 lb

## 2016-10-03 DIAGNOSIS — Z01419 Encounter for gynecological examination (general) (routine) without abnormal findings: Secondary | ICD-10-CM

## 2016-10-03 DIAGNOSIS — Z124 Encounter for screening for malignant neoplasm of cervix: Secondary | ICD-10-CM

## 2016-10-03 DIAGNOSIS — Z3041 Encounter for surveillance of contraceptive pills: Secondary | ICD-10-CM

## 2016-10-03 NOTE — Progress Notes (Signed)
Subjective:        Anita Baldwin is a 30 y.o. female here for a routine exam.  Current complaints: None.    Personal health questionnaire:  Is patient Anita Baldwin, have a family history of breast and/or ovarian cancer: no Is there a family history of uterine cancer diagnosed at age < 3050, gastrointestinal cancer, urinary tract cancer, family member who is a Personnel officerLynch syndrome-associated carrier: no Is the patient overweight and hypertensive, family history of diabetes, personal history of gestational diabetes, preeclampsia or PCOS: no Is patient over 7255, have PCOS,  family history of premature CHD under age 565, diabetes, smoke, have hypertension or peripheral artery disease:  no At any time, has a partner hit, kicked or otherwise hurt or frightened you?: no Over the past 2 weeks, have you felt down, depressed or hopeless?: no Over the past 2 weeks, have you felt little interest or pleasure in doing things?:no   Gynecologic History Patient's last menstrual period was 09/25/2016 (approximate). Contraception: OCP (estrogen/progesterone) Last Pap: 2016. Results were: normal Last mammogram: n/a. Results were: n/a  Obstetric History OB History  Gravida Para Term Preterm AB Living  2 2 2     2   SAB TAB Ectopic Multiple Live Births          2    # Outcome Date GA Lbr Len/2nd Weight Sex Delivery Anes PTL Lv  2 Term 07/18/12 1732w6d 24:14 / 01:28 8 lb 4.1 oz (3.745 kg) M Vag-Spont EPI  LIV     Birth Comments: WDL  1 Term 2006 6968w0d 48:00 7 lb 6 oz (3.345 kg) M Vag-Spont EPI  LIV      Past Medical History:  Diagnosis Date  . No pertinent past medical history     Past Surgical History:  Procedure Laterality Date  . NO PAST SURGERIES       Current Outpatient Prescriptions:  .  JUNEL FE 1/20 1-20 MG-MCG tablet, TAKE 1 TABLET BY MOUTH EVERY DAY, Disp: 28 tablet, Rfl: 7 .  fexofenadine-pseudoephedrine (ALLEGRA-D) 60-120 MG per tablet, Take 1 tablet by mouth every 12 (twelve)  hours. (Patient not taking: Reported on 10/03/2016), Disp: 30 tablet, Rfl: 0 .  fluticasone (FLONASE) 50 MCG/ACT nasal spray, Place 2 sprays into both nostrils 2 (two) times daily. Decrease to 2 sprays/nostril daily after 5 days (Patient not taking: Reported on 10/03/2016), Disp: 16 g, Rfl: 2 .  nitrofurantoin, macrocrystal-monohydrate, (MACROBID) 100 MG capsule, Take 1 capsule (100 mg total) by mouth 2 (two) times daily. (Patient not taking: Reported on 10/03/2016), Disp: 14 capsule, Rfl: 2 .  Olopatadine HCl (PATADAY) 0.2 % SOLN, 1 drop per eye once daily as needed for redness, itching, or irritation (Patient not taking: Reported on 10/03/2016), Disp: 2.5 mL, Rfl: 0 .  predniSONE (STERAPRED UNI-PAK) 5 MG TABS tablet, Use as directed on package instructions (Patient not taking: Reported on 10/03/2016), Disp: 21 tablet, Rfl: 0 No Known Allergies  Social History  Substance Use Topics  . Smoking status: Never Smoker  . Smokeless tobacco: Never Used  . Alcohol use No    Family History  Problem Relation Age of Onset  . Diabetes Sister   . Hypertension Maternal Grandfather   . Hypertension Paternal Grandmother   . Cancer Paternal Grandmother     breast  . Diabetes Paternal Grandmother       Review of Systems  Constitutional: negative for fatigue and weight loss Respiratory: negative for cough and wheezing Cardiovascular: negative for chest pain,  fatigue and palpitations Gastrointestinal: negative for abdominal pain and change in bowel habits Musculoskeletal:negative for myalgias Neurological: negative for gait problems and tremors Behavioral/Psych: negative for abusive relationship, depression Endocrine: negative for temperature intolerance    Genitourinary:negative for abnormal menstrual periods, genital lesions, hot flashes, sexual problems and vaginal discharge Integument/breast: negative for breast lump, breast tenderness, nipple discharge and skin lesion(s)    Objective:       BP  (!) 159/106   Pulse 86   Temp 97.6 F (36.4 C) (Oral)   Wt 207 lb 1.6 oz (93.9 kg)   LMP 09/25/2016 (Approximate)   BMI 35.55 kg/m  General:   alert  Skin:   no rash or abnormalities  Lungs:   clear to auscultation bilaterally  Heart:   regular rate and rhythm, S1, S2 normal, no murmur, click, rub or gallop  Breasts:   normal without suspicious masses, skin or nipple changes or axillary nodes  Abdomen:  normal findings: no organomegaly, soft, non-tender and no hernia  Pelvis:  External genitalia: normal general appearance Urinary system: urethral meatus normal and bladder without fullness, nontender Vaginal: normal without tenderness, induration or masses Cervix: normal appearance Adnexa: normal bimanual exam Uterus: anteverted and non-tender, normal size   Lab Review Urine pregnancy test Labs reviewed yes Radiologic studies reviewed no  50% of 20 min visit spent on counseling and coordination of care.    Assessment:    Healthy female exam.    Contraceptive Surveillance.  Pleased with OCP.   Plan:    Continue OCP's.  Education reviewed: calcium supplements, low fat, low cholesterol diet, safe sex/STD prevention, self breast exams and weight bearing exercise. Contraception: OCP (estrogen/progesterone). Follow up in: 1 year.   No orders of the defined types were placed in this encounter.  No orders of the defined types were placed in this encounter.    Patient ID: Anita Baldwin, female   DOB: 06/17/1986, 30 y.o.   MRN: [id number]

## 2016-10-06 LAB — NUSWAB BV AND CANDIDA, NAA
Candida albicans, NAA: NEGATIVE
Candida glabrata, NAA: NEGATIVE

## 2016-10-06 LAB — CYTOLOGY - PAP
Diagnosis: UNDETERMINED — AB
HPV (WINDOPATH): NOT DETECTED

## 2016-11-18 ENCOUNTER — Other Ambulatory Visit: Payer: Self-pay | Admitting: Obstetrics

## 2016-11-18 DIAGNOSIS — Z3041 Encounter for surveillance of contraceptive pills: Secondary | ICD-10-CM

## 2017-03-20 ENCOUNTER — Other Ambulatory Visit: Payer: Self-pay | Admitting: Obstetrics

## 2017-03-20 DIAGNOSIS — Z3041 Encounter for surveillance of contraceptive pills: Secondary | ICD-10-CM

## 2017-10-24 ENCOUNTER — Ambulatory Visit: Payer: Self-pay

## 2017-10-24 ENCOUNTER — Encounter: Payer: Self-pay | Admitting: *Deleted

## 2017-10-24 DIAGNOSIS — N912 Amenorrhea, unspecified: Secondary | ICD-10-CM

## 2017-10-24 DIAGNOSIS — Z3202 Encounter for pregnancy test, result negative: Secondary | ICD-10-CM

## 2017-10-24 LAB — POCT URINE PREGNANCY: Preg Test, Ur: POSITIVE — AB

## 2017-10-24 NOTE — Progress Notes (Signed)
[name] presents today for UPT. She has no unusual complaints. LMP: 07/2017? Unsure exact date per pt    OBJECTIVE: Appears well, in no apparent distress.  OB History    Gravida Para Term Preterm AB Living   2 2 2     2    SAB TAB Ectopic Multiple Live Births           2     Home UPT Result: positive  In-Office UPT result: positive  I have reviewed the patient's medical, obstetrical, social, and family histories, and medications.   ASSESSMENT: Positive pregnancy test  PLAN Prenatal care to be completed at:  [facility]  Pt to start PNV's

## 2017-10-30 NOTE — L&D Delivery Note (Signed)
Patient: Rondall AllegraRenida S Castelli MRN: [id number]  GBS status: positive, IAP given penicillin adequate  Patient is a 32 y.o. now G3P3003 s/p NSVD at 6330w0d, who was admitted for IOL for non-reactive NST. ROM 3h 5571m prior to delivery with clear fluid.    Delivery Note At 1:28 PM a viable female was delivered via SVD. (Presentation: ;  ).  APGARs pending   weight pending  Placenta status: spontaneous, intact Cord:  with the following complications: none  Anesthesia:  epidural Episiotomy:  none Lacerations:  none Suture Repair: n/a Est. Blood Loss (mL):  200  Head delivered ROA. Loose nuchal cord present, delivered through. Shoulder and body delivered in usual fashion. Infant with spontaneous cry, placed on mother's abdomen, dried and bulb suctioned. Cord clamped x 2 after 1-minute delay, and cut by family member. Cord blood drawn. Placenta delivered spontaneously with gentle cord traction. Fundus firm with massage and Pitocin. Perineum inspected and found to have no lacerations.  Mom to postpartum with plans to have BTL this afternoon.  Baby to Couplet care / Skin to Skin.  Tamera StandsLaurel S Christoph Copelan, DO 06/07/2018, 2:05 PM

## 2017-11-05 DIAGNOSIS — Z3490 Encounter for supervision of normal pregnancy, unspecified, unspecified trimester: Secondary | ICD-10-CM | POA: Insufficient documentation

## 2017-11-06 ENCOUNTER — Other Ambulatory Visit (HOSPITAL_COMMUNITY)
Admission: RE | Admit: 2017-11-06 | Discharge: 2017-11-06 | Disposition: A | Discharge status: Home / Self Care | Payer: Medicaid Other | Source: Ambulatory Visit | Attending: Obstetrics | Admitting: Obstetrics

## 2017-11-06 ENCOUNTER — Ambulatory Visit (INDEPENDENT_AMBULATORY_CARE_PROVIDER_SITE_OTHER): Payer: Medicaid Other | Admitting: Obstetrics

## 2017-11-06 ENCOUNTER — Encounter: Payer: Self-pay | Admitting: Obstetrics

## 2017-11-06 ENCOUNTER — Other Ambulatory Visit: Payer: Self-pay

## 2017-11-06 VITALS — BP 129/89 | HR 106 | Temp 98.4°F | Wt 207.3 lb

## 2017-11-06 DIAGNOSIS — Z3491 Encounter for supervision of normal pregnancy, unspecified, first trimester: Secondary | ICD-10-CM | POA: Diagnosis not present

## 2017-11-06 DIAGNOSIS — O219 Vomiting of pregnancy, unspecified: Secondary | ICD-10-CM

## 2017-11-06 DIAGNOSIS — Z3490 Encounter for supervision of normal pregnancy, unspecified, unspecified trimester: Secondary | ICD-10-CM | POA: Diagnosis present

## 2017-11-06 DIAGNOSIS — Z3481 Encounter for supervision of other normal pregnancy, first trimester: Secondary | ICD-10-CM

## 2017-11-06 MED ORDER — DOXYLAMINE-PYRIDOXINE 10-10 MG PO TBEC: DELAYED_RELEASE_TABLET | ORAL | 5 refills | Status: DC

## 2017-11-06 NOTE — Progress Notes (Signed)
Subjective:    Anita Baldwin is being seen today for her first obstetrical visit.  This is a planned pregnancy. She is at [redacted]w[redacted]d gestation. Her obstetrical history is significant for obesity. Relationship with FOB: unknown. Patient does intend to breast feed. Pregnancy history fully reviewed.  The information documented in the HPI was reviewed and verified.  Menstrual History: OB History    Gravida Para Term Preterm AB Living   3 2 2     2    SAB TAB Ectopic Multiple Live Births           2       Patient's last menstrual period was 08/16/2017.    Past Medical History:  Diagnosis Date  . Hypertension   . No pertinent past medical history     Past Surgical History:  Procedure Laterality Date  . NO PAST SURGERIES       (Not in a hospital admission) No Known Allergies  Social History   Tobacco Use  . Smoking status: Never Smoker  . Smokeless tobacco: Never Used  Substance Use Topics  . Alcohol use: No    Alcohol/week: 0.0 oz    Family History  Problem Relation Age of Onset  . Diabetes Sister   . Hypertension Maternal Grandfather   . Hypertension Paternal Grandmother   . Cancer Paternal Grandmother        breast  . Diabetes Paternal Grandmother   . Cancer Paternal Aunt      Review of Systems Constitutional: negative for weight loss Gastrointestinal: positive for nausea Genitourinary:negative for genital lesions and vaginal discharge and dysuria Musculoskeletal:negative for back pain Behavioral/Psych: negative for abusive relationship, depression, illegal drug usage and tobacco use    Objective:    BP 129/89   Pulse (!) 106   Temp 98.4 F (36.9 C)   Wt 207 lb 4.8 oz (94 kg)   LMP 08/16/2017   BMI 35.58 kg/m  General Appearance:    Alert, cooperative, no distress, appears stated age  Head:    Normocephalic, without obvious abnormality, atraumatic  Eyes:    PERRL, conjunctiva/corneas clear, EOM's intact, fundi    benign, both eyes  Ears:    Normal TM's  and external ear canals, both ears  Nose:   Nares normal, septum midline, mucosa normal, no drainage    or sinus tenderness  Throat:   Lips, mucosa, and tongue normal; teeth and gums normal  Neck:   Supple, symmetrical, trachea midline, no adenopathy;    thyroid:  no enlargement/tenderness/nodules; no carotid   bruit or JVD  Back:     Symmetric, no curvature, ROM normal, no CVA tenderness  Lungs:     Clear to auscultation bilaterally, respirations unlabored  Chest Wall:    No tenderness or deformity   Heart:    Regular rate and rhythm, S1 and S2 normal, no murmur, rub   or gallop  Breast Exam:    No tenderness, masses, or nipple abnormality  Abdomen:     Soft, non-tender, bowel sounds active all four quadrants,    no masses, no organomegaly  Genitalia:    Normal female without lesion, discharge or tenderness  Extremities:   Extremities normal, atraumatic, no cyanosis or edema  Pulses:   2+ and symmetric all extremities  Skin:   Skin color, texture, turgor normal, no rashes or lesions  Lymph nodes:   Cervical, supraclavicular, and axillary nodes normal  Neurologic:   CNII-XII intact, normal strength, sensation and reflexes  throughout      Lab Review Urine pregnancy test Labs reviewed yes Radiologic studies reviewed no Assessment:    Pregnancy at 4876w5d weeks    Plan:      Prenatal vitamins.  Counseling provided regarding continued use of seat belts, cessation of alcohol consumption, smoking or use of illicit drugs; infection precautions i.e., influenza/TDAP immunizations, toxoplasmosis,CMV, parvovirus, listeria and varicella; workplace safety, exercise during pregnancy; routine dental care, safe medications, sexual activity, hot tubs, saunas, pools, travel, caffeine use, fish and methlymercury, potential toxins, hair treatments, varicose veins Weight gain recommendations per IOM guidelines reviewed: underweight/BMI< 18.5--> gain 28 - 40 lbs; normal weight/BMI 18.5 - 24.9--> gain  25 - 35 lbs; overweight/BMI 25 - 29.9--> gain 15 - 25 lbs; obese/BMI >30->gain  11 - 20 lbs Problem list reviewed and updated. FIRST/CF mutation testing/NIPT/QUAD SCREEN/fragile X/[name] population testing/Spinal muscular atrophy discussed: requested. Role of ultrasound in pregnancy discussed; fetal survey: requested. Amniocentesis discussed: not indicated. VBAC calculator score: VBAC consent form provided No orders of the defined types were placed in this encounter.  No orders of the defined types were placed in this encounter.   Follow up in 4 weeks. 50% of 20 min visit spent on counseling and coordination of care.    Brock BadHARLES A. Caelin Rayl MD

## 2017-11-06 NOTE — Progress Notes (Signed)
Needs Rx for PNV. Complains of all day Nausea, no vomiting.   Declined the FLU

## 2017-11-07 LAB — CERVICOVAGINAL ANCILLARY ONLY
Bacterial vaginitis: NEGATIVE
Candida vaginitis: NEGATIVE
Chlamydia: NEGATIVE
Neisseria Gonorrhea: NEGATIVE
Trichomonas: NEGATIVE

## 2017-11-08 LAB — OBSTETRIC PANEL, INCLUDING HIV
Antibody Screen: NEGATIVE
BASOS: 0 %
Basophils Absolute: 0 10*3/uL (ref 0.0–0.2)
EOS (ABSOLUTE): 0.1 10*3/uL (ref 0.0–0.4)
Eos: 1 %
HEMATOCRIT: 39.3 % (ref 34.0–46.6)
HEMOGLOBIN: 13 g/dL (ref 11.1–15.9)
HEP B S AG: NEGATIVE
HIV Screen 4th Generation wRfx: NONREACTIVE
IMMATURE GRANS (ABS): 0 10*3/uL (ref 0.0–0.1)
Immature Granulocytes: 0 %
LYMPHS: 19 %
Lymphocytes Absolute: 1.7 10*3/uL (ref 0.7–3.1)
MCH: 26.8 pg (ref 26.6–33.0)
MCHC: 33.1 g/dL (ref 31.5–35.7)
MCV: 81 fL (ref 79–97)
Monocytes Absolute: 0.6 10*3/uL (ref 0.1–0.9)
Monocytes: 7 %
Neutrophils Absolute: 6.6 10*3/uL (ref 1.4–7.0)
Neutrophils: 73 %
Platelets: 349 10*3/uL (ref 150–379)
RBC: 4.85 x10E6/uL (ref 3.77–5.28)
RDW: 15.2 % (ref 12.3–15.4)
RPR: NONREACTIVE
RUBELLA: 1.59 {index} (ref >0.99)
Rh Factor: POSITIVE
WBC: 9 10*3/uL (ref 3.4–10.8)

## 2017-11-08 LAB — CYTOLOGY - PAP
Diagnosis: NEGATIVE
Diagnosis: REACTIVE
HPV: NOT DETECTED

## 2017-11-08 LAB — HEMOGLOBINOPATHY EVALUATION
HEMOGLOBIN F QUANTITATION: 0 % (ref 0.0–2.0)
HGB C: 0 %
HGB S: 0 %
HGB VARIANT: 0 %
Hemoglobin A2 Quantitation: 2.2 % (ref 1.8–3.2)
Hgb A: 97.8 % (ref 96.4–98.8)

## 2017-11-09 LAB — CULTURE, OB URINE

## 2017-11-09 LAB — URINE CULTURE, OB REFLEX

## 2017-11-13 LAB — CYSTIC FIBROSIS MUTATION 97: GENE DIS ANAL CARRIER INTERP BLD/T-IMP: NOT DETECTED

## 2017-12-04 ENCOUNTER — Encounter: Payer: Medicaid Other | Admitting: Obstetrics

## 2017-12-06 ENCOUNTER — Ambulatory Visit (INDEPENDENT_AMBULATORY_CARE_PROVIDER_SITE_OTHER): Payer: Medicaid Other | Admitting: Obstetrics

## 2017-12-06 ENCOUNTER — Encounter: Payer: Self-pay | Admitting: Obstetrics

## 2017-12-06 VITALS — BP 133/87 | HR 112 | Temp 99.1°F | Wt 210.6 lb

## 2017-12-06 DIAGNOSIS — Z3480 Encounter for supervision of other normal pregnancy, unspecified trimester: Secondary | ICD-10-CM

## 2017-12-06 DIAGNOSIS — Z3482 Encounter for supervision of other normal pregnancy, second trimester: Secondary | ICD-10-CM

## 2017-12-06 NOTE — Progress Notes (Signed)
Subjective:  Shaylon Lamont SnowballS Oyen is a 32 y.o. G3P2002 at 283w0d being seen today for ongoing prenatal care.  She is currently monitored for the following issues for this low-risk pregnancy and has Supervision of normal pregnancy, antepartum on their problem list.  Patient reports no complaints.  Contractions: Not present. Vag. Bleeding: None.  Movement: Present. Denies leaking of fluid.   The following portions of the patient's history were reviewed and updated as appropriate: allergies, current medications, past family history, past medical history, past social history, past surgical history and problem list. Problem list updated.  Objective:   Vitals:   12/06/17 0949  BP: 133/87  Pulse: (!) 112  Temp: 99.1 F (37.3 C)  Weight: 210 lb 9.6 oz (95.5 kg)    Fetal Status: Fetal Heart Rate (bpm): 150   Movement: Present     General:  Alert, oriented and cooperative. Patient is in no acute distress.  Skin: Skin is warm and dry. No rash noted.   Cardiovascular: Normal heart rate noted  Respiratory: Normal respiratory effort, no problems with respiration noted  Abdomen: Soft, gravid, appropriate for gestational age. Pain/Pressure: Absent     Pelvic:  Cervical exam deferred        Extremities: Normal range of motion.  Edema: None  Mental Status: Normal mood and affect. Normal behavior. Normal judgment and thought content.   Urinalysis:      Assessment and Plan:  Pregnancy: G3P2002 at 453w0d  1. Supervision of other normal pregnancy, antepartum Rx: - US MFM OB COMP + 14 WK; Future - AFP TETRA  Preterm labor symptoms and general obstetric precautions including but not limited to vaginal bleeding, contractions, leaking of fluid and fetal movement were reviewed in detail with the patient. Please refer to After Visit Summary for other counseling recommendations.  Return in about 4 weeks (around 01/03/2018) for ROB.   Brock BadHarper, Zakaiya Lares A, MD

## 2017-12-12 ENCOUNTER — Other Ambulatory Visit: Payer: Self-pay | Admitting: Obstetrics

## 2017-12-12 ENCOUNTER — Telehealth: Payer: Self-pay

## 2017-12-12 DIAGNOSIS — O280 Abnormal hematological finding on antenatal screening of mother: Secondary | ICD-10-CM

## 2017-12-12 DIAGNOSIS — Z3482 Encounter for supervision of other normal pregnancy, second trimester: Secondary | ICD-10-CM

## 2017-12-12 NOTE — Telephone Encounter (Signed)
Returned call, answered questions about results, and called and left message with change of appt date/time.

## 2017-12-12 NOTE — Telephone Encounter (Signed)
Contacted pt and advised of results and upcoming appts for u/s and counseling.

## 2017-12-14 ENCOUNTER — Ambulatory Visit (HOSPITAL_COMMUNITY)
Admission: RE | Admit: 2017-12-14 | Discharge: 2017-12-14 | Disposition: A | Discharge status: Home / Self Care | Payer: Medicaid Other | Source: Ambulatory Visit | Attending: Obstetrics | Admitting: Obstetrics

## 2017-12-14 ENCOUNTER — Other Ambulatory Visit: Payer: Self-pay | Admitting: Obstetrics

## 2017-12-14 ENCOUNTER — Other Ambulatory Visit (HOSPITAL_COMMUNITY): Payer: Self-pay | Admitting: *Deleted

## 2017-12-14 ENCOUNTER — Encounter (HOSPITAL_COMMUNITY): Payer: Self-pay

## 2017-12-14 DIAGNOSIS — Z3A16 16 weeks gestation of pregnancy: Secondary | ICD-10-CM | POA: Insufficient documentation

## 2017-12-14 DIAGNOSIS — Z3689 Encounter for other specified antenatal screening: Secondary | ICD-10-CM | POA: Insufficient documentation

## 2017-12-14 DIAGNOSIS — O281 Abnormal biochemical finding on antenatal screening of mother: Secondary | ICD-10-CM

## 2017-12-14 DIAGNOSIS — Z363 Encounter for antenatal screening for malformations: Secondary | ICD-10-CM

## 2017-12-14 DIAGNOSIS — O351XX0 Maternal care for (suspected) chromosomal abnormality in fetus, not applicable or unspecified: Secondary | ICD-10-CM | POA: Insufficient documentation

## 2017-12-14 DIAGNOSIS — Z3687 Encounter for antenatal screening for uncertain dates: Secondary | ICD-10-CM

## 2017-12-14 DIAGNOSIS — Z315 Encounter for genetic counseling: Secondary | ICD-10-CM | POA: Diagnosis not present

## 2017-12-14 DIAGNOSIS — O289 Unspecified abnormal findings on antenatal screening of mother: Secondary | ICD-10-CM | POA: Insufficient documentation

## 2017-12-14 DIAGNOSIS — Z810 Family history of intellectual disabilities: Secondary | ICD-10-CM | POA: Insufficient documentation

## 2017-12-14 DIAGNOSIS — Z3482 Encounter for supervision of other normal pregnancy, second trimester: Secondary | ICD-10-CM

## 2017-12-14 LAB — AFP TETRA
DIA VALUE (EIA): 294.26 pg/mL
DSR (By Age)    1 IN: 436
GESTATIONAL AGE AFP: 14.9 wk
MATERNAL AGE AT EDD: 33.1 a
MSAFP: 22.5 ng/mL
MSHCG: 102048 m[IU]/mL
T18 (By Age): 1:1698 {titer}
Weight: 210 [lb_av]
uE3 Value: 0.55 ng/mL

## 2017-12-14 LAB — QUAD SCREEN FOR MFM

## 2017-12-14 NOTE — Progress Notes (Signed)
Genetic Counseling  High-Risk Gestation Note  Appointment Date:  12/14/2017 Referred By: Anita Bad, MD Date of Birth:  03-Oct-1986 Partner:  Anita Baldwin   Pregnancy History: [id number] Estimated Date of Delivery: 05/31/18 Estimated Gestational Age: [redacted]w[redacted]d Attending: Particia Nearing, MD   Anita Baldwin was seen for genetic counseling because of an increased risk for fetal Down syndrome based on Quad screen.  In summary:  Ultrasound performed today visualized pregnancy to be [redacted]w[redacted]d, changing EDC to 05/31/18  Discussed that Quad screen was thus, drawn too early in gestation and is invalid  Discussed additional screening options  Repeat Quad screen- drawn today (send to [facility])  NIPS- declined at this time, given that patient is low risk for fetal aneuploidy based on age alone  Ultrasound- performed today; see separate report  Follow-up ultrasound scheduled 01/09/18  Discussed diagnostic testing options  Amniocentesis- declined today  Reviewed family history concerns- advanced paternal age (33 years old)  Anita Baldwin had Quad screening, which resulted with an increased risk for Down syndrome. She was counseled that screening tests are used to modify a patient's a priori risk for aneuploidy, typically based on age. This estimate provides a pregnancy specific risk assessment.  We also discussed other explanations for a screen positive result including: a gestational dating error, differences in maternal metabolism, and normal variation. Detailed ultrasound performed today visualized the pregnancy to be [redacted]w[redacted]d gestation, changing the EDC to 05/31/18. Thus, the previously drawn Quad screen was performed at [redacted]w[redacted]d gestation, which is too early in gestation for accurate interpretation.   We discussed the available screening options for fetal aneuploidy. Considering [name]'s maternal age of 32 y.o., we discussed that she is considered to be in the low risk  category for having a fetus with aneuploidy. We reviewed chromosomes, nondisjunction, and the associated risk for fetal aneuploidy related to a maternal age of 32 y.o. at [redacted]w[redacted]d gestation. She was counseled that the risk for aneuploidy decreases as gestational age increases, accounting for those pregnancies which spontaneously abort. We briefly discussed common fetal chromosome conditions including the features and prognoses of each.   We reviewed available routine screening options including repeat Quad screen using corrected EDC and detailed ultrasound. She was counseled that screening tests are used to modify a patient's a priori risk for aneuploidy, typically based on age. This estimate provides a pregnancy specific risk assessment. We reviewed the benefits and limitations of each option. Specifically, we discussed the conditions for which each test screens, the detection rates, and false positive rates of each. She was also briefly counseled regarding noninvasive prenatal screening (NIPS)/cell free DNA (cfDNA) screening and diagnostic testing via amniocentesis. We discussed that while NIPS and diagnostic testing should be made available to all pregnant patients, ACOG recommends that this testing be offered to those patients who are considered to have a "high" risk for fetal aneuploidy (i.e. those who are AMA, have an abnormal maternal serum or combined screening result, have an abnormal finding by fetal ultrasound, or have a family history of a specific chromosome condition). We discussed the possible results that the tests might provide including: positive, negative, unanticipated, and no result. Finally, she was counseled regarding the cost of each option and potential out of pocket expenses.   After consideration of all the options, she elected to pursue repeat Quad screen, which was drawn today. She declined NIPS and amniocentesis at this time. Ultrasound report from today's visit is reported separately.  Follow-up ultrasound  was scheduled for 01/09/18 to complete fetal anatomic survey. She understands that screening tests cannot rule out all birth defects or genetic syndromes.   Anita Baldwin was provided with written information regarding cystic fibrosis (CF), spinal muscular atrophy (SMA) and hemoglobinopathies including the carrier frequency, availability of carrier screening and prenatal diagnosis if indicated.  In addition, we discussed that CF and hemoglobinopathies are routinely screened for as part of the  newborn screening panel, and SMA is available on newborn screening under a research protocol in [place] for parents who enroll in [place] study.  CF carrier screening and hemoglobin electrophoresis were within normal range through her OB provider, significantly reducing the risk for carrier status for either of these conditions for the patient.    Both family histories were reviewed and found to be contributory for a female maternal first cousin (her maternal aunt's son) with intellectual disability. She reported that he is currently in his 30s and lives with his parent. He was described to be ambulatory, have some speech, and to not be fully toilet trained. He was not described to have dysmorphic features, and an underlying cause is not known. The patient reported that her aunt had four additional sons and daughters with no history of intellectual disability. Anita Baldwin was counseled that there are many different causes of intellectual disabilities including environmental, multifactorial, and genetic etiologies.  We discussed that a specific diagnosis for intellectual disability can be determined in approximately 50% of these individuals.  In the remaining 50% of individuals, a diagnosis may never be determined.  Regarding genetic causes, we discussed that chromosome aberrations (aneuploidy, deletions, duplications, insertions, and translocations) are responsible for a small percentage  of individuals with intellectual disability.  Many individuals with chromosome aberrations have additional differences, including congenital anomalies or minor dysmorphisms.  Likewise, single gene conditions are the underlying cause of intellectual delay in some families.  We discussed that many gene conditions have intellectual disability as a feature, but also often include other physical or medical differences.  Specifically, we reviewed fragile X syndrome and the X-linked inheritance of this condition.  We discussed the option of FMR1 (the gene that when altered causes fragile X syndrome) carrier screening for the patient to determine whether Fragile X syndrome is the cause of intellectual disability in this family.  Anita Baldwin declined FMR1 analysis today. We discussed that without more specific information, it is difficult to provide an accurate risk assessment.  Further genetic counseling is warranted if more information is obtained.  Additionally, the father of the pregnancy is reportedly 64 years old.We discussed that advanced paternal age (APA) is defined as paternal age greater than or equal to age 66.  Recent large-scale sequencing studies have shown that approximately 80% of de novo point mutations are of paternal origin.  Many studies have demonstrated a strong correlation between increased paternal age and de novo point mutations.  Although no specific data is available regarding fetal risks for fathers 59+ years old at conception, it is apparent that the overall risk for single gene conditions is increased.  To estimate the relative increase in risk of a genetic disorder with APA, the heritability of the disease must be considered.  Assuming an approximate 2x increase in risk for conditions that are exclusively paternal in origin, the risk for each individual condition is still relatively low.  It is estimated that the overall chance for a de novo mutation is ~0.5%.  We also discussed the wide  range of  conditions which can be caused by new dominant gene mutations (achondroplasia, neurofibromatosis, Marfan syndrome etc.).      She was counseled that diagnostic testing for each individual single gene condition is not warranted or available unless ultrasound or concerns lend suspicion to a specific condition. However, there is another NIPS platform ([employer]) that is able to assess for specific mutations in a panel of 30 selected genes covering 26 conditions. Most of these conditions follow an autosomal dominant pattern of inheritance and typically occur due to de novo gene mutations. The detection rates for these conditions vary depending upon the specific condition but range from 43% to 96%. Therefore, this screening would not identify all new dominant gene mutations. We reviewed that both maternal and paternal samples are required for this testing. We discussed risks, benefits, and limitations. The father of the pregnancy was not present at today's visit. Anita Baldwin indicated that she was not interested in this screening at this time. We discussed the recommendation for a detailed ultrasound at 18+ weeks gestation and a follow up ultrasound at ~28 weeks to monitor fetal growth. Without further information regarding the provided family history, an accurate genetic risk cannot be calculated. Further genetic counseling is warranted if more information is obtained.  Anita Baldwin denied exposure to environmental toxins or chemical agents. She denied the use of alcohol, tobacco or street drugs. She denied significant viral illnesses during the course of her pregnancy.   I counseled Anita Baldwin for approximately 35 minutes regarding the above risks and available options.   Quinn PlowmanKaren Victorhugo Preis, MS,  Certified Genetic Counselor 12/14/2017

## 2017-12-21 ENCOUNTER — Encounter: Payer: Self-pay | Admitting: *Deleted

## 2017-12-21 ENCOUNTER — Telehealth (HOSPITAL_COMMUNITY): Payer: Self-pay | Admitting: MS"

## 2017-12-21 NOTE — Telephone Encounter (Signed)
Talked with patient regarding Quad screen results, which are within normal limits. Patient identified by name and DOB. Reviewed reduction in risks for conditions screened. All questions answered to her satisfaction at this time.   Clydie BraunKaren Alia Parsley 12/21/2017 4:11 PM

## 2018-01-03 ENCOUNTER — Ambulatory Visit (INDEPENDENT_AMBULATORY_CARE_PROVIDER_SITE_OTHER): Payer: Medicaid Other | Admitting: Obstetrics

## 2018-01-03 ENCOUNTER — Encounter: Payer: Self-pay | Admitting: Obstetrics

## 2018-01-03 ENCOUNTER — Other Ambulatory Visit: Payer: Self-pay

## 2018-01-03 DIAGNOSIS — Z3492 Encounter for supervision of normal pregnancy, unspecified, second trimester: Secondary | ICD-10-CM

## 2018-01-03 DIAGNOSIS — Z3490 Encounter for supervision of normal pregnancy, unspecified, unspecified trimester: Secondary | ICD-10-CM

## 2018-01-03 NOTE — Progress Notes (Signed)
Subjective:  Anita Baldwin is a 32 y.o. G3P2002 at 4772w6d being seen today for ongoing prenatal care.  She is currently monitored for the following issues for this low-risk pregnancy and has Supervision of normal pregnancy, antepartum; Family history of intellectual disability; and [redacted] weeks gestation of pregnancy on their problem list.  Patient reports no complaints.  Contractions: Not present. Vag. Bleeding: None.  Movement: Present. Denies leaking of fluid.   The following portions of the patient's history were reviewed and updated as appropriate: allergies, current medications, past family history, past medical history, past social history, past surgical history and problem list. Problem list updated.  Objective:   Vitals:   01/03/18 1003  BP: 136/89  Pulse: (!) 108  Weight: 217 lb 11.2 oz (98.7 kg)    Fetal Status: Fetal Heart Rate (bpm): 150   Movement: Present     General:  Alert, oriented and cooperative. Patient is in no acute distress.  Skin: Skin is warm and dry. No rash noted.   Cardiovascular: Normal heart rate noted  Respiratory: Normal respiratory effort, no problems with respiration noted  Abdomen: Soft, gravid, appropriate for gestational age. Pain/Pressure: Absent     Pelvic:  Cervical exam deferred        Extremities: Normal range of motion.  Edema: None  Mental Status: Normal mood and affect. Normal behavior. Normal judgment and thought content.   Urinalysis:      Assessment and Plan:  Pregnancy: G3P2002 at 5372w6d  1. Encounter for supervision of normal pregnancy, antepartum, unspecified gravidity   Preterm labor symptoms and general obstetric precautions including but not limited to vaginal bleeding, contractions, leaking of fluid and fetal movement were reviewed in detail with the patient. Please refer to After Visit Summary for other counseling recommendations.  Return in about 4 weeks (around 01/31/2018) for ROB.   Brock BadHarper, Joeseph Verville A, MD

## 2018-01-03 NOTE — Progress Notes (Signed)
Reports no problems today. 

## 2018-01-04 ENCOUNTER — Ambulatory Visit (HOSPITAL_COMMUNITY): Payer: Medicaid Other

## 2018-01-09 ENCOUNTER — Encounter (HOSPITAL_COMMUNITY): Payer: Self-pay

## 2018-01-09 ENCOUNTER — Ambulatory Visit (HOSPITAL_COMMUNITY)
Admission: RE | Admit: 2018-01-09 | Discharge: 2018-01-09 | Disposition: A | Discharge status: Home / Self Care | Payer: Medicaid Other | Source: Ambulatory Visit | Attending: Obstetrics | Admitting: Obstetrics

## 2018-01-09 DIAGNOSIS — Z3A19 19 weeks gestation of pregnancy: Secondary | ICD-10-CM | POA: Diagnosis not present

## 2018-01-09 DIAGNOSIS — O99212 Obesity complicating pregnancy, second trimester: Secondary | ICD-10-CM | POA: Diagnosis not present

## 2018-01-09 DIAGNOSIS — Z363 Encounter for antenatal screening for malformations: Secondary | ICD-10-CM | POA: Diagnosis not present

## 2018-01-09 DIAGNOSIS — O289 Unspecified abnormal findings on antenatal screening of mother: Secondary | ICD-10-CM | POA: Insufficient documentation

## 2018-01-09 DIAGNOSIS — E669 Obesity, unspecified: Secondary | ICD-10-CM | POA: Insufficient documentation

## 2018-01-09 DIAGNOSIS — Z810 Family history of intellectual disabilities: Secondary | ICD-10-CM

## 2018-01-31 ENCOUNTER — Ambulatory Visit (INDEPENDENT_AMBULATORY_CARE_PROVIDER_SITE_OTHER): Payer: Medicaid Other | Admitting: Obstetrics and Gynecology

## 2018-01-31 ENCOUNTER — Encounter: Payer: Self-pay | Admitting: Obstetrics and Gynecology

## 2018-01-31 VITALS — BP 124/87 | HR 114 | Wt 225.0 lb

## 2018-01-31 DIAGNOSIS — Z3490 Encounter for supervision of normal pregnancy, unspecified, unspecified trimester: Secondary | ICD-10-CM

## 2018-01-31 DIAGNOSIS — Z3482 Encounter for supervision of other normal pregnancy, second trimester: Secondary | ICD-10-CM

## 2018-01-31 NOTE — Progress Notes (Signed)
   PRENATAL VISIT NOTE  Subjective:  Anita Baldwin is a 32 y.o. G3P2002 at 4675w6d being seen today for ongoing prenatal care.  She is currently monitored for the following issues for this low-risk pregnancy and has Supervision of normal pregnancy, antepartum; Family history of intellectual disability; and [redacted] weeks gestation of pregnancy on their problem list.  Patient reports no complaints.  Contractions: Not present. Vag. Bleeding: None.  Movement: Present. Denies leaking of fluid.   The following portions of the patient's history were reviewed and updated as appropriate: allergies, current medications, past family history, past medical history, past social history, past surgical history and problem list. Problem list updated.  Objective:   Vitals:   01/31/18 0953  BP: 124/87  Pulse: (!) 114  Weight: 225 lb (102.1 kg)    Fetal Status: Fetal Heart Rate (bpm): 140 Fundal Height: 24 cm Movement: Present     General:  Alert, oriented and cooperative. Patient is in no acute distress.  Skin: Skin is warm and dry. No rash noted.   Cardiovascular: Normal heart rate noted  Respiratory: Normal respiratory effort, no problems with respiration noted  Abdomen: Soft, gravid, appropriate for gestational age.  Pain/Pressure: Absent     Pelvic: Cervical exam deferred        Extremities: Normal range of motion.  Edema: None  Mental Status: Normal mood and affect. Normal behavior. Normal judgment and thought content.   Assessment and Plan:  Pregnancy: G3P2002 at 5975w6d  1. Encounter for supervision of normal pregnancy, antepartum, unspecified gravidity Patient is doing well without complaints Anatomy ultrasound reviewed Third trimester labs with glucola next visit Patient desires salpingectomy for BTL in the hopes that it will decrease post tubal ligation syndrome  Preterm labor symptoms and general obstetric precautions including but not limited to vaginal bleeding, contractions, leaking of  fluid and fetal movement were reviewed in detail with the patient. Please refer to After Visit Summary for other counseling recommendations.  Return in about 1 month (around 02/28/2018) for ROB, 2 hr glucola next visit.  No future appointments.  Catalina AntiguaPeggy Deandrae Wajda, MD

## 2018-02-28 ENCOUNTER — Encounter: Payer: Self-pay | Admitting: *Deleted

## 2018-02-28 ENCOUNTER — Other Ambulatory Visit: Payer: Medicaid Other

## 2018-02-28 ENCOUNTER — Ambulatory Visit (INDEPENDENT_AMBULATORY_CARE_PROVIDER_SITE_OTHER): Payer: Medicaid Other | Admitting: Obstetrics

## 2018-02-28 ENCOUNTER — Encounter: Payer: Self-pay | Admitting: Obstetrics

## 2018-02-28 VITALS — BP 121/82 | HR 114 | Wt 227.4 lb

## 2018-02-28 DIAGNOSIS — R12 Heartburn: Secondary | ICD-10-CM

## 2018-02-28 DIAGNOSIS — O26892 Other specified pregnancy related conditions, second trimester: Secondary | ICD-10-CM

## 2018-02-28 DIAGNOSIS — Z3490 Encounter for supervision of normal pregnancy, unspecified, unspecified trimester: Secondary | ICD-10-CM

## 2018-02-28 DIAGNOSIS — J301 Allergic rhinitis due to pollen: Secondary | ICD-10-CM

## 2018-02-28 DIAGNOSIS — Z23 Encounter for immunization: Secondary | ICD-10-CM | POA: Diagnosis not present

## 2018-02-28 DIAGNOSIS — O26899 Other specified pregnancy related conditions, unspecified trimester: Secondary | ICD-10-CM

## 2018-02-28 DIAGNOSIS — Z3482 Encounter for supervision of other normal pregnancy, second trimester: Secondary | ICD-10-CM

## 2018-02-28 MED ORDER — RANITIDINE HCL 150 MG PO TABS: 150.0000 mg | ORAL_TABLET | Freq: Two times a day (BID) | ORAL | 5 refills | Status: AC

## 2018-02-28 MED ORDER — LORATADINE 10 MG PO TABS: 10.0000 mg | ORAL_TABLET | Freq: Every day | ORAL | 11 refills | Status: DC

## 2018-02-28 NOTE — Progress Notes (Signed)
Subjective:  Anita Baldwin is a 32 y.o. G3P2002 at [redacted]w[redacted]d being seen today for ongoing prenatal care.  She is currently monitored for the following issues for this low-risk pregnancy and has Supervision of normal pregnancy, antepartum; Family history of intellectual disability; and [redacted] weeks gestation of pregnancy on their problem list.  Patient reports heartburn and seasonal allergies.  Contractions: Not present. Vag. Bleeding: None.  Movement: Present. Denies leaking of fluid.   The following portions of the patient's history were reviewed and updated as appropriate: allergies, current medications, past family history, past medical history, past social history, past surgical history and problem list. Problem list updated.  Objective:   Vitals:   02/28/18 0901 02/28/18 0903  BP: (!) 142/87 121/82  Pulse: (!) 114   Weight: 227 lb 6.4 oz (103.1 kg)     Fetal Status: Fetal Heart Rate (bpm): 140   Movement: Present     General:  Alert, oriented and cooperative. Patient is in no acute distress.  Skin: Skin is warm and dry. No rash noted.   Cardiovascular: Normal heart rate noted  Respiratory: Normal respiratory effort, no problems with respiration noted  Abdomen: Soft, gravid, appropriate for gestational age. Pain/Pressure: Absent     Pelvic:  Cervical exam deferred        Extremities: Normal range of motion.  Edema: None  Mental Status: Normal mood and affect. Normal behavior. Normal judgment and thought content.   Urinalysis:      Assessment and Plan:  Pregnancy: G3P2002 at [redacted]w[redacted]d  1. Encounter for supervision of normal pregnancy, antepartum, unspecified gravidity Rx: - CBC - Glucose Tolerance, 2 Hours w/1 Hour - HIV antibody - RPR  2. Seasonal allergic rhinitis due to pollen Rx: - loratadine (CLARITIN) 10 MG tablet; Take 1 tablet (10 mg total) by mouth daily.  Dispense: 30 tablet; Refill: 11  3. Heartburn during pregnancy, antepartum Rx: - ranitidine (ZANTAC) 150 MG tablet;  Take 1 tablet (150 mg total) by mouth 2 (two) times daily.  Dispense: 60 tablet; Refill: 5  Preterm labor symptoms and general obstetric precautions including but not limited to vaginal bleeding, contractions, leaking of fluid and fetal movement were reviewed in detail with the patient. Please refer to After Visit Summary for other counseling recommendations.  Return in about 2 weeks (around 03/14/2018) for ROB.   [name], MD

## 2018-02-28 NOTE — Progress Notes (Signed)
ROB presents for 2 gtt. TDAP given today w/o difficulty.

## 2018-03-01 LAB — CBC
Hematocrit: 37 % (ref 34.0–46.6)
Hemoglobin: 12 g/dL (ref 11.1–15.9)
MCH: 26.5 pg — AB (ref 26.6–33.0)
MCHC: 32.4 g/dL (ref 31.5–35.7)
MCV: 82 fL (ref 79–97)
Platelets: 253 10*3/uL (ref 150–379)
RBC: 4.53 x10E6/uL (ref 3.77–5.28)
RDW: 16.3 % — AB (ref 12.3–15.4)
WBC: 10 10*3/uL (ref 3.4–10.8)

## 2018-03-01 LAB — GLUCOSE TOLERANCE, 2 HOURS W/ 1HR
Glucose, 1 hour: 105 mg/dL (ref 65–179)
Glucose, 2 hour: 114 mg/dL (ref 65–152)
Glucose, Fasting: 73 mg/dL (ref 65–91)

## 2018-03-01 LAB — HIV ANTIBODY (ROUTINE TESTING W REFLEX): HIV Screen 4th Generation wRfx: NONREACTIVE

## 2018-03-01 LAB — RPR: RPR Ser Ql: NONREACTIVE

## 2018-03-14 ENCOUNTER — Other Ambulatory Visit: Payer: Self-pay

## 2018-03-14 ENCOUNTER — Ambulatory Visit (INDEPENDENT_AMBULATORY_CARE_PROVIDER_SITE_OTHER): Payer: Medicaid Other | Admitting: Obstetrics

## 2018-03-14 ENCOUNTER — Encounter: Payer: Self-pay | Admitting: Obstetrics

## 2018-03-14 VITALS — BP 139/89 | HR 98 | Wt 230.6 lb

## 2018-03-14 DIAGNOSIS — Z3490 Encounter for supervision of normal pregnancy, unspecified, unspecified trimester: Secondary | ICD-10-CM

## 2018-03-14 DIAGNOSIS — Z3493 Encounter for supervision of normal pregnancy, unspecified, third trimester: Secondary | ICD-10-CM

## 2018-03-14 DIAGNOSIS — Z3689 Encounter for other specified antenatal screening: Secondary | ICD-10-CM

## 2018-03-14 NOTE — Progress Notes (Signed)
Subjective:  Anita Baldwin is a 32 y.o. G3P2002 at [redacted]w[redacted]d being seen today for ongoing prenatal care.  She is currently monitored for the following issues for this low-risk pregnancy and has Supervision of normal pregnancy, antepartum; Family history of intellectual disability; and [redacted] weeks gestation of pregnancy on their problem list.  Patient reports no complaints.  Contractions: Not present. Vag. Bleeding: None.  Movement: Present. Denies leaking of fluid.   The following portions of the patient's history were reviewed and updated as appropriate: allergies, current medications, past family history, past medical history, past social history, past surgical history and problem list. Problem list updated.  Objective:   Vitals:   03/14/18 0912  BP: 139/89  Pulse: 98  Weight: 230 lb 9.6 oz (104.6 kg)    Fetal Status: Fetal Heart Rate (bpm): 140   Movement: Present     General:  Alert, oriented and cooperative. Patient is in no acute distress.  Skin: Skin is warm and dry. No rash noted.   Cardiovascular: Normal heart rate noted  Respiratory: Normal respiratory effort, no problems with respiration noted  Abdomen: Soft, gravid, appropriate for gestational age. Pain/Pressure: Present     Pelvic:  Cervical exam deferred        Extremities: Normal range of motion.  Edema: None  Mental Status: Normal mood and affect. Normal behavior. Normal judgment and thought content.   Urinalysis:      Assessment and Plan:  Pregnancy: G3P2002 at [redacted]w[redacted]d  1. Encounter for supervision of normal pregnancy, antepartum, unspecified gravidity  2. Encounter for ultrasound to assess interval growth of fetus Rx: - [place] MFM OB FOLLOW UP; Future   Preterm labor symptoms and general obstetric precautions including but not limited to vaginal bleeding, contractions, leaking of fluid and fetal movement were reviewed in detail with the patient. Please refer to After Visit Summary for other counseling recommendations.   Return in about 2 weeks (around 03/28/2018) for ROB.   [name], MD

## 2018-03-28 ENCOUNTER — Other Ambulatory Visit (HOSPITAL_COMMUNITY): Payer: Self-pay | Admitting: *Deleted

## 2018-03-28 ENCOUNTER — Ambulatory Visit (HOSPITAL_COMMUNITY)
Admission: RE | Admit: 2018-03-28 | Discharge: 2018-03-28 | Disposition: A | Discharge status: Home / Self Care | Payer: Medicaid Other | Source: Ambulatory Visit | Attending: Obstetrics | Admitting: Obstetrics

## 2018-03-28 ENCOUNTER — Encounter (HOSPITAL_COMMUNITY): Payer: Self-pay

## 2018-03-28 ENCOUNTER — Encounter: Payer: Self-pay | Admitting: Obstetrics

## 2018-03-28 ENCOUNTER — Ambulatory Visit (INDEPENDENT_AMBULATORY_CARE_PROVIDER_SITE_OTHER): Payer: Medicaid Other | Admitting: Obstetrics

## 2018-03-28 VITALS — BP 132/87 | HR 103 | Wt 232.0 lb

## 2018-03-28 DIAGNOSIS — Z3689 Encounter for other specified antenatal screening: Secondary | ICD-10-CM | POA: Insufficient documentation

## 2018-03-28 DIAGNOSIS — Z3483 Encounter for supervision of other normal pregnancy, third trimester: Secondary | ICD-10-CM

## 2018-03-28 DIAGNOSIS — Z3A30 30 weeks gestation of pregnancy: Secondary | ICD-10-CM | POA: Insufficient documentation

## 2018-03-28 DIAGNOSIS — O99212 Obesity complicating pregnancy, second trimester: Secondary | ICD-10-CM | POA: Diagnosis not present

## 2018-03-28 DIAGNOSIS — E669 Obesity, unspecified: Secondary | ICD-10-CM | POA: Insufficient documentation

## 2018-03-28 DIAGNOSIS — Z3490 Encounter for supervision of normal pregnancy, unspecified, unspecified trimester: Secondary | ICD-10-CM

## 2018-03-28 DIAGNOSIS — O289 Unspecified abnormal findings on antenatal screening of mother: Secondary | ICD-10-CM | POA: Insufficient documentation

## 2018-03-28 DIAGNOSIS — Z362 Encounter for other antenatal screening follow-up: Secondary | ICD-10-CM

## 2018-03-28 NOTE — Progress Notes (Signed)
Pt had u/s this morning and has repeat scheduled in 6 weeks.

## 2018-03-28 NOTE — Progress Notes (Signed)
Subjective:  Anita Baldwin is a 32 y.o. G3P2002 at [redacted]w[redacted]d being seen today for ongoing prenatal care.  She is currently monitored for the following issues for this low-risk pregnancy and has Supervision of normal pregnancy, antepartum; Family history of intellectual disability; and [redacted] weeks gestation of pregnancy on their problem list.  Patient reports no complaints.  Contractions: Not present.  .  Movement: Present. Denies leaking of fluid.   The following portions of the patient's history were reviewed and updated as appropriate: allergies, current medications, past family history, past medical history, past social history, past surgical history and problem list. Problem list updated.  Objective:   Vitals:   03/28/18 0915  BP: 132/87  Pulse: (!) 103  Weight: 232 lb (105.2 kg)    Fetal Status:     Movement: Present     General:  Alert, oriented and cooperative. Patient is in no acute distress.  Skin: Skin is warm and dry. No rash noted.   Cardiovascular: Normal heart rate noted  Respiratory: Normal respiratory effort, no problems with respiration noted  Abdomen: Soft, gravid, appropriate for gestational age. Pain/Pressure: Present     Pelvic:  Cervical exam deferred        Extremities: Normal range of motion.     Mental Status: Normal mood and affect. Normal behavior. Normal judgment and thought content.   Urinalysis:      Assessment and Plan:  Pregnancy: G3P2002 at [redacted]w[redacted]d  1. Encounter for supervision of normal pregnancy, antepartum, unspecified gravidity   Preterm labor symptoms and general obstetric precautions including but not limited to vaginal bleeding, contractions, leaking of fluid and fetal movement were reviewed in detail with the patient. Please refer to After Visit Summary for other counseling recommendations.  Return in about 2 weeks (around 04/11/2018) for ROB.   [name], MD

## 2018-04-03 DIAGNOSIS — Z3482 Encounter for supervision of other normal pregnancy, second trimester: Secondary | ICD-10-CM

## 2018-04-11 ENCOUNTER — Ambulatory Visit (INDEPENDENT_AMBULATORY_CARE_PROVIDER_SITE_OTHER): Payer: Medicaid Other | Admitting: Obstetrics

## 2018-04-11 ENCOUNTER — Encounter: Payer: Self-pay | Admitting: Obstetrics

## 2018-04-11 VITALS — BP 120/82 | HR 93 | Wt 232.3 lb

## 2018-04-11 DIAGNOSIS — Z3403 Encounter for supervision of normal first pregnancy, third trimester: Secondary | ICD-10-CM

## 2018-04-11 DIAGNOSIS — Z3490 Encounter for supervision of normal pregnancy, unspecified, unspecified trimester: Secondary | ICD-10-CM

## 2018-04-11 NOTE — Progress Notes (Signed)
Subjective:  Anita Baldwin is a 32 y.o. G3P2002 at 3834w6d being seen today for ongoing prenatal care.  She is currently monitored for the following issues for this low-risk pregnancy and has Supervision of normal pregnancy, antepartum; Family history of intellectual disability; and [redacted] weeks gestation of pregnancy on their problem list.  Patient reports no complaints.  Contractions: Not present. Vag. Bleeding: None.  Movement: Present. Denies leaking of fluid.   The following portions of the patient's history were reviewed and updated as appropriate: allergies, current medications, past family history, past medical history, past social history, past surgical history and problem list. Problem list updated.  Objective:   Vitals:   04/11/18 0913  BP: 120/82  Pulse: 93  Weight: 232 lb 4.8 oz (105.4 kg)    Fetal Status: Fetal Heart Rate (bpm): 140   Movement: Present     General:  Alert, oriented and cooperative. Patient is in no acute distress.  Skin: Skin is warm and dry. No rash noted.   Cardiovascular: Normal heart rate noted  Respiratory: Normal respiratory effort, no problems with respiration noted  Abdomen: Soft, gravid, appropriate for gestational age. Pain/Pressure: Absent     Pelvic:  Cervical exam deferred        Extremities: Normal range of motion.  Edema: None  Mental Status: Normal mood and affect. Normal behavior. Normal judgment and thought content.   Urinalysis:      Assessment and Plan:  Pregnancy: G3P2002 at 7534w6d  1. Encounter for supervision of normal pregnancy, antepartum, unspecified gravidity   Preterm labor symptoms and general obstetric precautions including but not limited to vaginal bleeding, contractions, leaking of fluid and fetal movement were reviewed in detail with the patient. Please refer to After Visit Summary for other counseling recommendations.  Return in about 2 weeks (around 04/25/2018) for ROB.   Brock BadHarper, Jerauld Bostwick A, MD

## 2018-04-23 ENCOUNTER — Encounter: Payer: Self-pay | Admitting: Obstetrics

## 2018-04-23 ENCOUNTER — Ambulatory Visit (INDEPENDENT_AMBULATORY_CARE_PROVIDER_SITE_OTHER): Payer: Medicaid Other | Admitting: Obstetrics

## 2018-04-23 VITALS — BP 121/86 | HR 104 | Wt 235.7 lb

## 2018-04-23 DIAGNOSIS — Z3483 Encounter for supervision of other normal pregnancy, third trimester: Secondary | ICD-10-CM

## 2018-04-23 DIAGNOSIS — Z3490 Encounter for supervision of normal pregnancy, unspecified, unspecified trimester: Secondary | ICD-10-CM

## 2018-04-23 NOTE — Progress Notes (Signed)
Subjective:  Anita Baldwin is a 32 y.o. G3P2002 at 8557w4d being seen today for ongoing prenatal care.  She is currently monitored for the following issues for this low-risk pregnancy and has Supervision of normal pregnancy, antepartum; Family history of intellectual disability; and [redacted] weeks gestation of pregnancy on their problem list.  Patient reports no complaints.  Contractions: Not present. Vag. Bleeding: None.  Movement: Present. Denies leaking of fluid.   The following portions of the patient's history were reviewed and updated as appropriate: allergies, current medications, past family history, past medical history, past social history, past surgical history and problem list. Problem list updated.  Objective:   Vitals:   04/23/18 0957  BP: 121/86  Pulse: (!) 104  Weight: 235 lb 11.2 oz (106.9 kg)    Fetal Status: Fetal Heart Rate (bpm): 140   Movement: Present     General:  Alert, oriented and cooperative. Patient is in no acute distress.  Skin: Skin is warm and dry. No rash noted.   Cardiovascular: Normal heart rate noted  Respiratory: Normal respiratory effort, no problems with respiration noted  Abdomen: Soft, gravid, appropriate for gestational age. Pain/Pressure: Present     Pelvic:  Cervical exam deferred        Extremities: Normal range of motion.  Edema: Trace  Mental Status: Normal mood and affect. Normal behavior. Normal judgment and thought content.   Urinalysis:      Assessment and Plan:  Pregnancy: G3P2002 at 6357w4d  1. Encounter for supervision of normal pregnancy, antepartum, unspecified gravidity   Preterm labor symptoms and general obstetric precautions including but not limited to vaginal bleeding, contractions, leaking of fluid and fetal movement were reviewed in detail with the patient. Please refer to After Visit Summary for other counseling recommendations.  Return in about 2 weeks (around 05/07/2018) for ROB.   Brock BadHarper, Eulalia Ellerman A, MD

## 2018-04-25 ENCOUNTER — Encounter: Payer: Medicaid Other | Admitting: Obstetrics

## 2018-05-09 ENCOUNTER — Other Ambulatory Visit (HOSPITAL_COMMUNITY): Payer: Self-pay | Admitting: Obstetrics and Gynecology

## 2018-05-09 ENCOUNTER — Encounter (HOSPITAL_COMMUNITY): Payer: Self-pay

## 2018-05-09 ENCOUNTER — Encounter: Payer: Self-pay | Admitting: Obstetrics

## 2018-05-09 ENCOUNTER — Other Ambulatory Visit (HOSPITAL_COMMUNITY)
Admission: RE | Admit: 2018-05-09 | Discharge: 2018-05-09 | Disposition: A | Discharge status: Home / Self Care | Payer: Medicaid Other | Source: Ambulatory Visit | Attending: Obstetrics | Admitting: Obstetrics

## 2018-05-09 ENCOUNTER — Ambulatory Visit (HOSPITAL_COMMUNITY)
Admission: RE | Admit: 2018-05-09 | Discharge: 2018-05-09 | Disposition: A | Discharge status: Home / Self Care | Payer: Medicaid Other | Source: Ambulatory Visit | Attending: Obstetrics | Admitting: Obstetrics

## 2018-05-09 ENCOUNTER — Ambulatory Visit (INDEPENDENT_AMBULATORY_CARE_PROVIDER_SITE_OTHER): Payer: Medicaid Other | Admitting: Obstetrics

## 2018-05-09 DIAGNOSIS — Z3A00 Weeks of gestation of pregnancy not specified: Secondary | ICD-10-CM | POA: Insufficient documentation

## 2018-05-09 DIAGNOSIS — O289 Unspecified abnormal findings on antenatal screening of mother: Secondary | ICD-10-CM | POA: Diagnosis not present

## 2018-05-09 DIAGNOSIS — Z3490 Encounter for supervision of normal pregnancy, unspecified, unspecified trimester: Secondary | ICD-10-CM | POA: Insufficient documentation

## 2018-05-09 DIAGNOSIS — O99213 Obesity complicating pregnancy, third trimester: Secondary | ICD-10-CM

## 2018-05-09 DIAGNOSIS — E669 Obesity, unspecified: Secondary | ICD-10-CM | POA: Diagnosis not present

## 2018-05-09 DIAGNOSIS — Z362 Encounter for other antenatal screening follow-up: Secondary | ICD-10-CM

## 2018-05-09 DIAGNOSIS — Z3483 Encounter for supervision of other normal pregnancy, third trimester: Secondary | ICD-10-CM

## 2018-05-09 DIAGNOSIS — Z3A36 36 weeks gestation of pregnancy: Secondary | ICD-10-CM

## 2018-05-09 NOTE — Progress Notes (Signed)
Pt c/o lack of sleep at noc.

## 2018-05-09 NOTE — Progress Notes (Signed)
Subjective:  Fidelis Lamont SnowballS Hartje is a 32 y.o. G3P2002 at 4130w6d being seen today for ongoing prenatal care.  She is currently monitored for the following issues for this low-risk pregnancy and has Supervision of normal pregnancy, antepartum; Family history of intellectual disability; and [redacted] weeks gestation of pregnancy on their problem list.  Patient reports difficulty getting to sleep.  Contractions: Not present. Vag. Bleeding: None.  Movement: Present. Denies leaking of fluid.   The following portions of the patient's history were reviewed and updated as appropriate: allergies, current medications, past family history, past medical history, past social history, past surgical history and problem list. Problem list updated.  Objective:   Vitals:   05/09/18 0930  BP: 123/83  Pulse: 93  Weight: 238 lb 14.4 oz (108.4 kg)    Fetal Status: Fetal Heart Rate (bpm): 140   Movement: Present     General:  Alert, oriented and cooperative. Patient is in no acute distress.  Skin: Skin is warm and dry. No rash noted.   Cardiovascular: Normal heart rate noted  Respiratory: Normal respiratory effort, no problems with respiration noted  Abdomen: Soft, gravid, appropriate for gestational age. Pain/Pressure: Present     Pelvic:  Cervical exam deferred        Extremities: Normal range of motion.  Edema: Trace  Mental Status: Normal mood and affect. Normal behavior. Normal judgment and thought content.   Urinalysis:      Assessment and Plan:  Pregnancy: G3P2002 at 2530w6d  1. Encounter for supervision of normal pregnancy, antepartum, unspecified gravidity Rx: - Cervicovaginal ancillary only - Strep Gp B NAA  Term labor symptoms and general obstetric precautions including but not limited to vaginal bleeding, contractions, leaking of fluid and fetal movement were reviewed in detail with the patient. Please refer to After Visit Summary for other counseling recommendations.  Return in about 1 week (around  05/16/2018) for ROB.   Brock BadHarper, Charles A, MD

## 2018-05-10 LAB — CERVICOVAGINAL ANCILLARY ONLY
Chlamydia: NEGATIVE
Neisseria Gonorrhea: NEGATIVE

## 2018-05-11 LAB — STREP GP B NAA: Strep Gp B NAA: POSITIVE — AB

## 2018-05-16 ENCOUNTER — Encounter: Payer: Self-pay | Admitting: Obstetrics

## 2018-05-16 ENCOUNTER — Ambulatory Visit (INDEPENDENT_AMBULATORY_CARE_PROVIDER_SITE_OTHER): Payer: Medicaid Other | Admitting: Obstetrics

## 2018-05-16 DIAGNOSIS — Z3493 Encounter for supervision of normal pregnancy, unspecified, third trimester: Secondary | ICD-10-CM

## 2018-05-16 DIAGNOSIS — Z3490 Encounter for supervision of normal pregnancy, unspecified, unspecified trimester: Secondary | ICD-10-CM

## 2018-05-16 NOTE — Progress Notes (Signed)
Subjective:  Anita Baldwin is a 32 y.o. G3P2002 at 192w6d being seen today for ongoing prenatal care.  She is currently monitored for the following issues for this low-risk pregnancy and has Supervision of normal pregnancy, antepartum; Family history of intellectual disability; and [redacted] weeks gestation of pregnancy on their problem list.  Patient reports no complaints.  Contractions: Not present. Vag. Bleeding: None.  Movement: Present. Denies leaking of fluid.   The following portions of the patient's history were reviewed and updated as appropriate: allergies, current medications, past family history, past medical history, past social history, past surgical history and problem list. Problem list updated.  Objective:   Vitals:   05/16/18 0906  BP: 123/84  Pulse: 92  Weight: 238 lb 9.6 oz (108.2 kg)    Fetal Status:     Movement: Present     General:  Alert, oriented and cooperative. Patient is in no acute distress.  Skin: Skin is warm and dry. No rash noted.   Cardiovascular: Normal heart rate noted  Respiratory: Normal respiratory effort, no problems with respiration noted  Abdomen: Soft, gravid, appropriate for gestational age. Pain/Pressure: Absent     Pelvic:  Cervical exam deferred        Extremities: Normal range of motion.  Edema: Trace  Mental Status: Normal mood and affect. Normal behavior. Normal judgment and thought content.   Urinalysis:      Assessment and Plan:  Pregnancy: G3P2002 at [redacted]w[redacted]d  1. Encounter for supervision of normal pregnancy, antepartum, unspecified gravidity - doing well  Term labor symptoms and general obstetric precautions including but not limited to vaginal bleeding, contractions, leaking of fluid and fetal movement were reviewed in detail with the patient. Please refer to After Visit Summary for other counseling recommendations.  Return in about 1 week (around 05/23/2018) for ROB.   Brock BadHarper, Charles A, MD

## 2018-05-16 NOTE — Progress Notes (Signed)
Patient reports good fetal movement, denies pain. 

## 2018-05-23 ENCOUNTER — Encounter: Payer: Medicaid Other | Admitting: Obstetrics

## 2018-05-24 ENCOUNTER — Encounter: Payer: Self-pay | Admitting: Obstetrics

## 2018-05-24 ENCOUNTER — Ambulatory Visit (INDEPENDENT_AMBULATORY_CARE_PROVIDER_SITE_OTHER): Payer: Medicaid Other | Admitting: Obstetrics

## 2018-05-24 ENCOUNTER — Telehealth (HOSPITAL_COMMUNITY): Payer: Self-pay | Admitting: *Deleted

## 2018-05-24 DIAGNOSIS — Z3490 Encounter for supervision of normal pregnancy, unspecified, unspecified trimester: Secondary | ICD-10-CM

## 2018-05-24 DIAGNOSIS — Z3493 Encounter for supervision of normal pregnancy, unspecified, third trimester: Secondary | ICD-10-CM

## 2018-05-24 NOTE — Progress Notes (Signed)
Subjective:  Joleigh Lamont SnowballS Gonzalo is a 32 y.o. G3P2002 at 7054w0d being seen today for ongoing prenatal care.  She is currently monitored for the following issues for this low-risk pregnancy and has Supervision of normal pregnancy, antepartum; Family history of intellectual disability; and [redacted] weeks gestation of pregnancy on their problem list.  Patient reports no complaints.  Contractions: Not present. Vag. Bleeding: None.  Movement: Present. Denies leaking of fluid.   The following portions of the patient's history were reviewed and updated as appropriate: allergies, current medications, past family history, past medical history, past social history, past surgical history and problem list. Problem list updated.  Objective:   Vitals:   05/24/18 1107  BP: 132/86  Pulse: 87  Weight: 238 lb 12.8 oz (108.3 kg)    Fetal Status:     Movement: Present     General:  Alert, oriented and cooperative. Patient is in no acute distress.  Skin: Skin is warm and dry. No rash noted.   Cardiovascular: Normal heart rate noted  Respiratory: Normal respiratory effort, no problems with respiration noted  Abdomen: Soft, gravid, appropriate for gestational age. Pain/Pressure: Present     Pelvic:  Cervical exam deferred        Extremities: Normal range of motion.  Edema: Trace  Mental Status: Normal mood and affect. Normal behavior. Normal judgment and thought content.   Urinalysis:      Assessment and Plan:  Pregnancy: G3P2002 at 1854w0d  1. Encounter for supervision of normal pregnancy, antepartum, unspecified gravidity   Term labor symptoms and general obstetric precautions including but not limited to vaginal bleeding, contractions, leaking of fluid and fetal movement were reviewed in detail with the patient. Please refer to After Visit Summary for other counseling recommendations.  Return in about 1 week (around 05/31/2018) for ROB.   Brock BadHarper, Charles A, MD

## 2018-05-24 NOTE — Progress Notes (Signed)
Patient reports good fetal movement, denies pain today. 

## 2018-05-24 NOTE — Telephone Encounter (Signed)
Preadmission screen  

## 2018-05-31 ENCOUNTER — Encounter: Payer: Medicaid Other | Admitting: Obstetrics

## 2018-05-31 ENCOUNTER — Ambulatory Visit (INDEPENDENT_AMBULATORY_CARE_PROVIDER_SITE_OTHER): Payer: Self-pay | Admitting: Obstetrics

## 2018-05-31 ENCOUNTER — Encounter: Payer: Self-pay | Admitting: Obstetrics

## 2018-05-31 DIAGNOSIS — Z3493 Encounter for supervision of normal pregnancy, unspecified, third trimester: Secondary | ICD-10-CM

## 2018-05-31 DIAGNOSIS — Z3490 Encounter for supervision of normal pregnancy, unspecified, unspecified trimester: Secondary | ICD-10-CM

## 2018-05-31 NOTE — Progress Notes (Signed)
Subjective:  Anita Baldwin is a 32 y.o. G3P2002 at 3638w0d being seen today for ongoing prenatal care.  She is currently monitored for the following issues for this low-risk pregnancy and has Supervision of normal pregnancy, antepartum; Family history of intellectual disability; and [redacted] weeks gestation of pregnancy on their problem list.  Patient reports occasional contractions.  Contractions: Not present. Vag. Bleeding: None.  Movement: Present. Denies leaking of fluid.   The following portions of the patient's history were reviewed and updated as appropriate: allergies, current medications, past family history, past medical history, past social history, past surgical history and problem list. Problem list updated.  Objective:   Vitals:   05/31/18 0812  BP: 134/80  Pulse: 87  Weight: 240 lb 3.2 oz (109 kg)    Fetal Status: Fetal Heart Rate (bpm): 140   Movement: Present     General:  Alert, oriented and cooperative. Patient is in no acute distress.  Skin: Skin is warm and dry. No rash noted.   Cardiovascular: Normal heart rate noted  Respiratory: Normal respiratory effort, no problems with respiration noted  Abdomen: Soft, gravid, appropriate for gestational age. Pain/Pressure: Present     Pelvic:  Cervical exam deferred        Extremities: Normal range of motion.  Edema: Trace  Mental Status: Normal mood and affect. Normal behavior. Normal judgment and thought content.   Urinalysis:      Assessment and Plan:  Pregnancy: G3P2002 at 6538w0d  1. Encounter for supervision of normal pregnancy, antepartum, unspecified gravidity  Term labor symptoms and general obstetric precautions including but not limited to vaginal bleeding, contractions, leaking of fluid and fetal movement were reviewed in detail with the patient. Please refer to After Visit Summary for other counseling recommendations.  Return in about 1 week (around 06/07/2018) for ROB.   Brock BadHarper, Charles A, MD

## 2018-06-05 ENCOUNTER — Other Ambulatory Visit: Payer: Self-pay | Admitting: Advanced Practice Midwife

## 2018-06-06 ENCOUNTER — Other Ambulatory Visit: Payer: Self-pay

## 2018-06-06 ENCOUNTER — Encounter (HOSPITAL_COMMUNITY): Payer: Self-pay | Admitting: *Deleted

## 2018-06-06 ENCOUNTER — Ambulatory Visit (INDEPENDENT_AMBULATORY_CARE_PROVIDER_SITE_OTHER): Payer: Medicaid Other | Admitting: Obstetrics and Gynecology

## 2018-06-06 ENCOUNTER — Inpatient Hospital Stay (HOSPITAL_COMMUNITY)
Admission: AD | Admit: 2018-06-06 | Discharge: 2018-06-08 | DRG: 798 | Disposition: A | Discharge status: Home / Self Care | Payer: Medicaid Other | Attending: Family Medicine | Admitting: Family Medicine

## 2018-06-06 ENCOUNTER — Encounter: Payer: Self-pay | Admitting: Obstetrics and Gynecology

## 2018-06-06 VITALS — BP 129/88 | HR 102 | Wt 240.1 lb

## 2018-06-06 DIAGNOSIS — Z3490 Encounter for supervision of normal pregnancy, unspecified, unspecified trimester: Secondary | ICD-10-CM

## 2018-06-06 DIAGNOSIS — Z3A41 41 weeks gestation of pregnancy: Secondary | ICD-10-CM | POA: Diagnosis not present

## 2018-06-06 DIAGNOSIS — O99824 Streptococcus B carrier state complicating childbirth: Secondary | ICD-10-CM | POA: Diagnosis not present

## 2018-06-06 DIAGNOSIS — Z810 Family history of intellectual disabilities: Secondary | ICD-10-CM

## 2018-06-06 DIAGNOSIS — Z302 Encounter for sterilization: Secondary | ICD-10-CM | POA: Diagnosis not present

## 2018-06-06 DIAGNOSIS — O480 Post-term pregnancy: Secondary | ICD-10-CM | POA: Diagnosis not present

## 2018-06-06 DIAGNOSIS — Z3A40 40 weeks gestation of pregnancy: Secondary | ICD-10-CM

## 2018-06-06 HISTORY — DX: Other specified health status: Z78.9

## 2018-06-06 LAB — CBC
HCT: 39.4 % (ref 36.0–46.0)
HEMOGLOBIN: 12.9 g/dL (ref 12.0–15.0)
MCH: 26.6 pg (ref 26.0–34.0)
MCHC: 32.7 g/dL (ref 30.0–36.0)
MCV: 81.2 fL (ref 78.0–100.0)
PLATELETS: 270 10*3/uL (ref 150–400)
RBC: 4.85 MIL/uL (ref 3.87–5.11)
RDW: 16.6 % — ABNORMAL HIGH (ref 11.5–15.5)
WBC: 8.9 10*3/uL (ref 4.0–10.5)

## 2018-06-06 LAB — TYPE AND SCREEN
ABO/RH(D): O POS
ANTIBODY SCREEN: NEGATIVE

## 2018-06-06 MED ORDER — OXYTOCIN 40 UNITS IN LACTATED RINGERS INFUSION - SIMPLE MED: 2.5000 [IU]/h | INTRAVENOUS | Status: DC

## 2018-06-06 MED ORDER — LACTATED RINGERS IV SOLN: 500.0000 mL | INTRAVENOUS | Status: DC | PRN

## 2018-06-06 MED ORDER — ACETAMINOPHEN 325 MG PO TABS: 650.0000 mg | ORAL_TABLET | ORAL | Status: DC | PRN

## 2018-06-06 MED ORDER — SOD CITRATE-CITRIC ACID 500-334 MG/5ML PO SOLN
30.0000 mL | ORAL | Status: DC | PRN
  Filled 2018-06-06: qty 15

## 2018-06-06 MED ORDER — OXYTOCIN BOLUS FROM INFUSION
500.0000 mL | Freq: Once | INTRAVENOUS | Status: AC
  Administered 2018-06-07: 500 mL via INTRAVENOUS

## 2018-06-06 MED ORDER — SODIUM CHLORIDE 0.9 % IV SOLN
5.0000 10*6.[IU] | Freq: Once | INTRAVENOUS | Status: DC
  Administered 2018-06-06: 5 10*6.[IU] via INTRAVENOUS
  Filled 2018-06-06: qty 5

## 2018-06-06 MED ORDER — PENICILLIN G POT IN DEXTROSE 60000 UNIT/ML IV SOLN
3.0000 10*6.[IU] | INTRAVENOUS | Status: DC
  Administered 2018-06-06 – 2018-06-07 (×4): 3 10*6.[IU] via INTRAVENOUS
  Filled 2018-06-06 (×8): qty 50

## 2018-06-06 MED ORDER — OXYCODONE-ACETAMINOPHEN 5-325 MG PO TABS: 1.0000 | ORAL_TABLET | ORAL | Status: DC | PRN

## 2018-06-06 MED ORDER — LACTATED RINGERS IV SOLN
INTRAVENOUS | Status: DC
  Administered 2018-06-06 – 2018-06-07 (×4): via INTRAVENOUS

## 2018-06-06 MED ORDER — TERBUTALINE SULFATE 1 MG/ML IJ SOLN: 0.2500 mg | Freq: Once | INTRAMUSCULAR | Status: DC | PRN

## 2018-06-06 MED ORDER — ONDANSETRON HCL 4 MG/2ML IJ SOLN: 4.0000 mg | Freq: Four times a day (QID) | INTRAMUSCULAR | Status: DC | PRN

## 2018-06-06 MED ORDER — DIPHENHYDRAMINE HCL 50 MG/ML IJ SOLN
12.5000 mg | Freq: Four times a day (QID) | INTRAMUSCULAR | Status: DC | PRN
  Administered 2018-06-06: 12.5 mg via INTRAVENOUS
  Filled 2018-06-06: qty 1

## 2018-06-06 MED ORDER — MISOPROSTOL 50MCG HALF TABLET
50.0000 ug | ORAL_TABLET | ORAL | Status: DC | PRN
  Administered 2018-06-06 (×2): 50 ug via BUCCAL
  Filled 2018-06-06 (×2): qty 1

## 2018-06-06 MED ORDER — LIDOCAINE HCL (PF) 1 % IJ SOLN
30.0000 mL | INTRAMUSCULAR | Status: DC | PRN
  Filled 2018-06-06: qty 30

## 2018-06-06 MED ORDER — FENTANYL CITRATE (PF) 100 MCG/2ML IJ SOLN
100.0000 ug | INTRAMUSCULAR | Status: DC | PRN
  Administered 2018-06-06 (×4): 100 ug via INTRAVENOUS
  Filled 2018-06-06 (×4): qty 2

## 2018-06-06 MED ORDER — OXYCODONE-ACETAMINOPHEN 5-325 MG PO TABS: 2.0000 | ORAL_TABLET | ORAL | Status: DC | PRN

## 2018-06-06 NOTE — Progress Notes (Addendum)
Labor Progress Note Anita Baldwin is a 32 y.o. G3P2002 at 5956w6d presented for uncomplicated SOL.  S:  Feeling well, no complaints. O:  Cervix @ 2cm.  BP 120/72   Pulse 95   Temp 97.8 F (36.6 C) (Axillary)   Resp 16   Ht 5\' 4"  (1.626 m)   Wt 108.4 kg   LMP 08/16/2017   BMI 41.02 kg/m  EFM: 130/mod variability/multiple accels, no decels  CVE: Dilation: 2 Effacement (%): 50 Cervical Position: Middle Station: -3 Presentation: Vertex Exam by:: Dr Corky Downslsen   A&P: 32 y.o. [id number] 8356w6d for SOL.  FB placed. Pit following FB removal. Epidural as needed.  Margarita RanaHarrison D Rose, Student-PA 11:28 PM

## 2018-06-06 NOTE — Progress Notes (Signed)
Pt is G3P2 6123w6d here for Foley bulb insertion. Pt reports no contractions and reports good fetal movement.

## 2018-06-06 NOTE — Progress Notes (Signed)
   PRENATAL VISIT NOTE  Subjective:  Anita Baldwin is a 32 y.o. G3P2002 at 3566w6d being seen today for ongoing prenatal care.  She is currently monitored for the following issues for this low-risk pregnancy and has Supervision of normal pregnancy, antepartum; Family history of intellectual disability; and [redacted] weeks gestation of pregnancy on their problem list.  Patient reports no complaints.  Contractions: Not present. Vag. Bleeding: None.  Movement: Present. Denies leaking of fluid.   The following portions of the patient's history were reviewed and updated as appropriate: allergies, current medications, past family history, past medical history, past social history, past surgical history and problem list. Problem list updated.  Objective:   Vitals:   06/06/18 1030  BP: 129/88  Pulse: (!) 102  Weight: 240 lb 1.6 oz (108.9 kg)    Fetal Status: Fetal Heart Rate (bpm): NST   Movement: Present     General:  Alert, oriented and cooperative. Patient is in no acute distress.  Skin: Skin is warm and dry. No rash noted.   Cardiovascular: Normal heart rate noted  Respiratory: Normal respiratory effort, no problems with respiration noted  Abdomen: Soft, gravid, appropriate for gestational age.  Pain/Pressure: Present     Pelvic: Cervical exam deferred        Extremities: Normal range of motion.  Edema: Trace  Mental Status: Normal mood and affect. Normal behavior. Normal judgment and thought content.   Assessment and Plan:  Pregnancy: G3P2002 at 8266w6d  1. Encounter for supervision of normal pregnancy, antepartum, unspecified gravidity Patient is doing well without complaints Scheduled for IOL tomorrow NST reviewed and non reactive with baseline 130, mod variability, no accels, no decels Unable to place outpatient foley today due to non reactive NST. Case discussed with Dr. Earlene Plateravis who agrees with plan for IOL today - Fetal nonstress test  Term labor symptoms and general obstetric  precautions including but not limited to vaginal bleeding, contractions, leaking of fluid and fetal movement were reviewed in detail with the patient. Please refer to After Visit Summary for other counseling recommendations.  No follow-ups on file.  Future Appointments  Date Time Provider Department Center  06/07/2018  7:30 AM [facility] None    Catalina AntiguaPeggy Kaiser Belluomini, MD

## 2018-06-06 NOTE — Progress Notes (Addendum)
Labor Progress Note Anita Baldwin is a 32 y.o. G3P2002 at 10546w6d presented for IOL for NRNST.  S:  Feeling well, no reported contractions.  O:  BP 133/90   Pulse 92   Temp 97.8 F (36.6 C) (Axillary)   Resp 14   Ht 5\' 4"  (1.626 m)   Wt 108.4 kg   LMP 08/16/2017   BMI 41.02 kg/m  EFM: 125 bpm/mod variability/some accels, no decels  CVE: Dilation: Fingertip Effacement (%): 50 Cervical Position: Middle Station: -2 Presentation: Vertex Exam by:: K.Wilson,RN   A&P: 32 y.o. G3P2002 7246w6d for SOL.  Foley bulb placed. Cytotec x2. Plan for foley bulb at next cervical check if >2cm.  #Labor: Progressing well. #Pain: epidural as needed #GBS positive. PCN.   Margarita RanaHarrison D Rose, Student-PA 10:29 PM

## 2018-06-06 NOTE — H&P (Signed)
LABOR AND DELIVERY ADMISSION HISTORY AND PHYSICAL NOTE  Anita Baldwin is a 32 y.o. female G36P2002 with IUP at [redacted]w[redacted]d by LMP presenting for IOL for NRNST. Seen at clinic today for planned outpatient FB placement for IOL for postdates tomororw. However, had NRNST at office visit and was sent for IOL.  She reports positive fetal movement. She denies leakage of fluid or vaginal bleeding.  Prenatal History/Complications: PNC at [facility] established at 10wk.  Pregnancy complications:  - family h/o intellectual disability   Past Medical History: Past Medical History:  Diagnosis Date  . Hypertension   . No pertinent past medical history     Past Surgical History: Past Surgical History:  Procedure Laterality Date  . NO PAST SURGERIES      Obstetrical History: OB History    Gravida  3   Para  2   Term  2   Preterm      AB      Living  2     SAB      TAB      Ectopic      Multiple      Live Births  2           Social History: Social History   Socioeconomic History  . Marital status: Single    Spouse name: Not on file  . Number of children: 2  . Years of education: Not on file  . Highest education level: Not on file  Occupational History  . Not on file  Social Needs  . Financial resource strain: Not on file  . Food insecurity:    Worry: Not on file    Inability: Not on file  . Transportation needs:    Medical: Not on file    Non-medical: Not on file  Tobacco Use  . Smoking status: Never Smoker  . Smokeless tobacco: Never Used  Substance and Sexual Activity  . Alcohol use: No    Alcohol/week: 0.0 standard drinks  . Drug use: No  . Sexual activity: Yes  Lifestyle  . Physical activity:    Days per week: Not on file    Minutes per session: Not on file  . Stress: Not on file  Relationships  . Social connections:    Talks on phone: Not on file    Gets together: Not on file    Attends religious service: Not on file    Active member of club or  organization: Not on file    Attends meetings of clubs or organizations: Not on file    Relationship status: Not on file  Other Topics Concern  . Not on file  Social History Narrative  . Not on file    Family History: Family History  Problem Relation Age of Onset  . Diabetes Sister   . Hypertension Maternal Grandfather   . Hypertension Paternal Grandmother   . Cancer Paternal Grandmother        breast  . Diabetes Paternal Grandmother   . Cancer Paternal Aunt     Allergies: No Known Allergies  Medications Prior to Admission  Medication Sig Dispense Refill Last Dose  . ranitidine (ZANTAC) 150 MG tablet Take 1 tablet (150 mg total) by mouth 2 (two) times daily. 60 tablet 5 06/06/2018 at Unknown time  . loratadine (CLARITIN) 10 MG tablet Take 1 tablet (10 mg total) by mouth daily. 30 tablet 11 06/03/2018  . Prenatal MV-Min-FA-Omega-3 (PRENATAL GUMMIES/DHA & FA) 0.4-32.5 MG CHEW Chew 1 tablet by mouth  daily.    06/04/2018     Review of Systems  All systems reviewed and negative except as stated in HPI  Physical Exam Blood pressure 128/78, pulse 87, temperature 98.8 F (37.1 C), temperature source Axillary, resp. rate 18, height 5\' 4"  (1.626 m), weight 108.4 kg, last menstrual period 08/16/2017. General appearance: alert, oriented, NAD Lungs: normal respiratory effort Heart: regular rate Abdomen: soft, non-tender; gravid, FH appropriate for GA Extremities: No calf swelling or tenderness Presentation: cephalic, confirmed with bedside sono  Fetal monitoring: 145 bpm, moderate variability, +acels, no decels  Uterine activity: q3-4 min  Dilation: Fingertip Effacement (%): 50 Station: -2 Exam by:: K.Wilson,RN  Prenatal labs: ABO, Rh: O/Positive/-- (01/08 1142) Antibody: Negative (01/08 1142) Rubella: 1.59 (01/08 1142) RPR: Non Reactive (05/02 1010)  HBsAg: Negative (01/08 1142)  HIV: Non Reactive (05/02 1010)  GC/Chlamydia: Negative  GBS: Positive (07/11 1353)  2-hr GTT:  Normal  Genetic screening:  Normal Quad  Anatomy US: Normal   Prenatal Transfer Tool  Maternal Diabetes: No Genetic Screening: Normal Maternal Ultrasounds/Referrals: Normal Fetal Ultrasounds or other Referrals:  None Maternal Substance Abuse:  No Significant Maternal Medications:  None Significant Maternal Lab Results: Lab values include: Group B Strep positive  Results for orders placed or performed during the hospital encounter of 06/06/18 (from the past 24 hour(s))  CBC   Collection Time: 06/06/18  1:35 PM  Result Value Ref Range   WBC 8.9 4.0 - 10.5 K/uL   RBC 4.85 3.87 - 5.11 MIL/uL   Hemoglobin 12.9 12.0 - 15.0 g/dL   HCT 09.839.4 11.936.0 - 14.746.0 %   MCV 81.2 78.0 - 100.0 fL   MCH 26.6 26.0 - 34.0 pg   MCHC 32.7 30.0 - 36.0 g/dL   RDW 82.916.6 (H) 56.211.5 - 13.015.5 %   Platelets 270 150 - 400 K/uL    Patient Active Problem List   Diagnosis Date Noted  . Post term pregnancy over 40 weeks 06/06/2018  . Family history of intellectual disability 12/14/2017  . [redacted] weeks gestation of pregnancy   . Supervision of normal pregnancy, antepartum 11/05/2017    Assessment: Anita Baldwin is a 32 y.o. G3P2002 at 2255w6d here for IOL for NRNST at clinic today. Had been planned IOL for postdates tomorrow.   #Labor: Latent labor. Will induce with cytotec, and likely place FB.  #Pain: Epidural upon request.  #FWB: Cat I  #ID:  [id number] PCN  #MOF: ? #MOC:BTL, papers signed  #Circ:  N/A  De Hollingsheadatherine L Bahja Bence 06/06/2018, 3:00 PM

## 2018-06-06 NOTE — Anesthesia Pain Management Evaluation Note (Signed)
  [facility] Pain Management Visit Note  Patient: Anita Baldwin, 32 y.o., female  "Hello I am a member of the anesthesia team at [facility]. We have an anesthesia team available at all times to provide care throughout the hospital, including epidural management and anesthesia for C-section. I don't know your plan for the delivery whether it a natural birth, water birth, IV sedation, nitrous supplementation, doula or epidural, but we want to meet your pain goals."   1.Was your pain managed to your expectations on prior hospitalizations?   Yes   2.What is your expectation for pain management during this hospitalization?     Epidural and IV pain meds  3.How can we help you reach that goal? IV meds, epidural  Record the patient's initial score and the patient's pain goal.   Pain: 4.5/10  Pain Goal: 2/10 [name] wants you to be able to say your pain was always managed very well.  Salome ArntSterling, Trena Dunavan Marie 06/06/2018

## 2018-06-06 NOTE — MAU Note (Signed)
Pt sent from office for non-reactive NST and induction.

## 2018-06-07 ENCOUNTER — Inpatient Hospital Stay (HOSPITAL_COMMUNITY): Payer: Medicaid Other | Admitting: Anesthesiology

## 2018-06-07 ENCOUNTER — Inpatient Hospital Stay (HOSPITAL_COMMUNITY): Admission: RE | Admit: 2018-06-07 | Payer: Medicaid Other | Source: Ambulatory Visit

## 2018-06-07 ENCOUNTER — Encounter (HOSPITAL_COMMUNITY): Payer: Self-pay | Admitting: Anesthesiology

## 2018-06-07 ENCOUNTER — Encounter (HOSPITAL_COMMUNITY)
Admission: AD | Disposition: A | Discharge status: Home / Self Care | Payer: Self-pay | Source: Home / Self Care | Attending: Family Medicine

## 2018-06-07 DIAGNOSIS — O480 Post-term pregnancy: Secondary | ICD-10-CM

## 2018-06-07 DIAGNOSIS — Z302 Encounter for sterilization: Secondary | ICD-10-CM

## 2018-06-07 DIAGNOSIS — O99824 Streptococcus B carrier state complicating childbirth: Secondary | ICD-10-CM

## 2018-06-07 DIAGNOSIS — Z3A41 41 weeks gestation of pregnancy: Secondary | ICD-10-CM

## 2018-06-07 HISTORY — PX: TUBAL LIGATION: SHX77

## 2018-06-07 LAB — RPR: RPR Ser Ql: NONREACTIVE

## 2018-06-07 SURGERY — LIGATION, FALLOPIAN TUBE, POSTPARTUM
Anesthesia: Epidural | Site: Abdomen | Laterality: Bilateral | Wound class: Clean Contaminated

## 2018-06-07 MED ORDER — ONDANSETRON HCL 4 MG PO TABS: 4.0000 mg | ORAL_TABLET | ORAL | Status: DC | PRN

## 2018-06-07 MED ORDER — LACTATED RINGERS IV SOLN
500.0000 mL | Freq: Once | INTRAVENOUS | Status: AC
  Administered 2018-06-07: 500 mL via INTRAVENOUS

## 2018-06-07 MED ORDER — FENTANYL 2.5 MCG/ML BUPIVACAINE 1/10 % EPIDURAL INFUSION (WH - ANES)
14.0000 mL/h | INTRAMUSCULAR | Status: DC | PRN
  Administered 2018-06-07: 14 mL/h via EPIDURAL
  Filled 2018-06-07: qty 100

## 2018-06-07 MED ORDER — NALBUPHINE HCL 10 MG/ML IJ SOLN
5.0000 mg | Freq: Once | INTRAMUSCULAR | Status: AC
Start: 2018-06-07 — End: 2018-06-07
  Administered 2018-06-07: 5 mg via INTRAVENOUS
  Filled 2018-06-07: qty 1

## 2018-06-07 MED ORDER — EPHEDRINE 5 MG/ML INJ: 10.0000 mg | INTRAVENOUS | Status: DC | PRN

## 2018-06-07 MED ORDER — ACETAMINOPHEN 325 MG PO TABS: 650.0000 mg | ORAL_TABLET | ORAL | Status: DC | PRN

## 2018-06-07 MED ORDER — ONDANSETRON HCL 4 MG/2ML IJ SOLN: 4.0000 mg | INTRAMUSCULAR | Status: DC | PRN

## 2018-06-07 MED ORDER — NALBUPHINE HCL 10 MG/ML IJ SOLN: 5.0000 mg | INTRAMUSCULAR | Status: DC | PRN

## 2018-06-07 MED ORDER — BUPIVACAINE HCL (PF) 0.25 % IJ SOLN
INTRAMUSCULAR | Status: AC
  Filled 2018-06-07: qty 30

## 2018-06-07 MED ORDER — MEPERIDINE HCL 25 MG/ML IJ SOLN: 6.2500 mg | INTRAMUSCULAR | Status: DC | PRN

## 2018-06-07 MED ORDER — LIDOCAINE HCL (PF) 1 % IJ SOLN
INTRAMUSCULAR | Status: DC | PRN
  Administered 2018-06-07: 5 mL via EPIDURAL

## 2018-06-07 MED ORDER — BENZOCAINE-MENTHOL 20-0.5 % EX AERO: 1.0000 "application " | INHALATION_SPRAY | CUTANEOUS | Status: DC | PRN

## 2018-06-07 MED ORDER — OXYTOCIN 40 UNITS IN LACTATED RINGERS INFUSION - SIMPLE MED
1.0000 m[IU]/min | INTRAVENOUS | Status: DC
  Administered 2018-06-07: 2 m[IU]/min via INTRAVENOUS
  Filled 2018-06-07: qty 1000

## 2018-06-07 MED ORDER — ONDANSETRON HCL 4 MG/2ML IJ SOLN: 4.0000 mg | Freq: Three times a day (TID) | INTRAMUSCULAR | Status: DC | PRN

## 2018-06-07 MED ORDER — PENICILLIN G 3 MILLION UNITS IVPB - SIMPLE MED
3.0000 10*6.[IU] | INTRAVENOUS | Status: DC
  Administered 2018-06-07: 3 10*6.[IU] via INTRAVENOUS
  Filled 2018-06-07: qty 3
  Filled 2018-06-07 (×4): qty 100

## 2018-06-07 MED ORDER — KETOROLAC TROMETHAMINE 30 MG/ML IJ SOLN
INTRAMUSCULAR | Status: AC
  Filled 2018-06-07: qty 1

## 2018-06-07 MED ORDER — DIPHENHYDRAMINE HCL 50 MG/ML IJ SOLN
12.5000 mg | Freq: Once | INTRAMUSCULAR | Status: AC
  Administered 2018-06-07: 12.5 mg via INTRAVENOUS

## 2018-06-07 MED ORDER — TERBUTALINE SULFATE 1 MG/ML IJ SOLN: 0.2500 mg | Freq: Once | INTRAMUSCULAR | Status: DC | PRN

## 2018-06-07 MED ORDER — HYDROXYZINE HCL 50 MG/ML IM SOLN
25.0000 mg | Freq: Four times a day (QID) | INTRAMUSCULAR | Status: DC | PRN
  Administered 2018-06-07: 25 mg via INTRAMUSCULAR
  Filled 2018-06-07 (×2): qty 0.5

## 2018-06-07 MED ORDER — DIPHENHYDRAMINE HCL 25 MG PO CAPS: 25.0000 mg | ORAL_CAPSULE | Freq: Four times a day (QID) | ORAL | Status: DC | PRN

## 2018-06-07 MED ORDER — SCOPOLAMINE 1 MG/3DAYS TD PT72: 1.0000 | MEDICATED_PATCH | Freq: Once | TRANSDERMAL | Status: DC

## 2018-06-07 MED ORDER — SOD CITRATE-CITRIC ACID 500-334 MG/5ML PO SOLN
30.0000 mL | ORAL | Status: AC
  Administered 2018-06-07: 30 mL via ORAL

## 2018-06-07 MED ORDER — FENTANYL CITRATE (PF) 100 MCG/2ML IJ SOLN: 25.0000 ug | INTRAMUSCULAR | Status: DC | PRN

## 2018-06-07 MED ORDER — NALOXONE HCL 0.4 MG/ML IJ SOLN: 0.4000 mg | INTRAMUSCULAR | Status: DC | PRN

## 2018-06-07 MED ORDER — PHENYLEPHRINE 40 MCG/ML (10ML) SYRINGE FOR IV PUSH (FOR BLOOD PRESSURE SUPPORT)
80.0000 ug | PREFILLED_SYRINGE | INTRAVENOUS | Status: DC | PRN
  Administered 2018-06-07: 80 ug via INTRAVENOUS

## 2018-06-07 MED ORDER — DIPHENHYDRAMINE HCL 50 MG/ML IJ SOLN: 25.0000 mg | Freq: Four times a day (QID) | INTRAMUSCULAR | Status: DC | PRN

## 2018-06-07 MED ORDER — FENTANYL CITRATE (PF) 100 MCG/2ML IJ SOLN
INTRAMUSCULAR | Status: AC
  Filled 2018-06-07: qty 2

## 2018-06-07 MED ORDER — ZOLPIDEM TARTRATE 5 MG PO TABS: 5.0000 mg | ORAL_TABLET | Freq: Every evening | ORAL | Status: DC | PRN

## 2018-06-07 MED ORDER — MIDAZOLAM HCL 2 MG/2ML IJ SOLN
INTRAMUSCULAR | Status: AC
  Filled 2018-06-07: qty 2

## 2018-06-07 MED ORDER — PHENYLEPHRINE 40 MCG/ML (10ML) SYRINGE FOR IV PUSH (FOR BLOOD PRESSURE SUPPORT)
80.0000 ug | PREFILLED_SYRINGE | INTRAVENOUS | Status: DC | PRN
  Filled 2018-06-07: qty 10

## 2018-06-07 MED ORDER — SODIUM CHLORIDE 0.9 % IR SOLN
Status: DC | PRN
  Administered 2018-06-07: 1

## 2018-06-07 MED ORDER — PHENYLEPHRINE 40 MCG/ML (10ML) SYRINGE FOR IV PUSH (FOR BLOOD PRESSURE SUPPORT)
PREFILLED_SYRINGE | INTRAVENOUS | Status: AC
  Filled 2018-06-07: qty 10

## 2018-06-07 MED ORDER — TETANUS-DIPHTH-ACELL PERTUSSIS 5-2.5-18.5 LF-MCG/0.5 IM SUSP: 0.5000 mL | Freq: Once | INTRAMUSCULAR | Status: DC

## 2018-06-07 MED ORDER — PHENYLEPHRINE 40 MCG/ML (10ML) SYRINGE FOR IV PUSH (FOR BLOOD PRESSURE SUPPORT): 80.0000 ug | PREFILLED_SYRINGE | INTRAVENOUS | Status: DC | PRN

## 2018-06-07 MED ORDER — SODIUM BICARBONATE 8.4 % IV SOLN
INTRAVENOUS | Status: DC | PRN
  Administered 2018-06-07: 10 mL via EPIDURAL

## 2018-06-07 MED ORDER — SODIUM CHLORIDE 0.9% FLUSH: 3.0000 mL | INTRAVENOUS | Status: DC | PRN

## 2018-06-07 MED ORDER — NALBUPHINE HCL 10 MG/ML IJ SOLN
5.0000 mg | Freq: Once | INTRAMUSCULAR | Status: AC | PRN
  Administered 2018-06-07: 5 mg via SUBCUTANEOUS

## 2018-06-07 MED ORDER — DIPHENHYDRAMINE HCL 50 MG/ML IJ SOLN: 12.5000 mg | INTRAMUSCULAR | Status: DC | PRN

## 2018-06-07 MED ORDER — LACTATED RINGERS IV SOLN: INTRAVENOUS | Status: DC

## 2018-06-07 MED ORDER — FENTANYL CITRATE (PF) 100 MCG/2ML IJ SOLN
INTRAMUSCULAR | Status: DC | PRN
  Administered 2018-06-07: 100 ug via EPIDURAL

## 2018-06-07 MED ORDER — NALBUPHINE HCL 10 MG/ML IJ SOLN
5.0000 mg | Freq: Once | INTRAMUSCULAR | Status: AC
  Administered 2018-06-07: 5 mg via INTRAVENOUS
  Filled 2018-06-07 (×2): qty 1

## 2018-06-07 MED ORDER — BUPIVACAINE HCL (PF) 0.25 % IJ SOLN
INTRAMUSCULAR | Status: DC | PRN
  Administered 2018-06-07: 10 mL

## 2018-06-07 MED ORDER — DIPHENHYDRAMINE HCL 50 MG/ML IJ SOLN
12.5000 mg | INTRAMUSCULAR | Status: DC | PRN
Start: 2018-06-07 — End: 2018-06-07

## 2018-06-07 MED ORDER — COCONUT OIL OIL: 1.0000 "application " | TOPICAL_OIL | Status: DC | PRN

## 2018-06-07 MED ORDER — NALBUPHINE HCL 10 MG/ML IJ SOLN
5.0000 mg | INTRAMUSCULAR | Status: DC | PRN
  Administered 2018-06-07: 5 mg via INTRAVENOUS
  Filled 2018-06-07: qty 1

## 2018-06-07 MED ORDER — KETOROLAC TROMETHAMINE 30 MG/ML IJ SOLN: 30.0000 mg | Freq: Four times a day (QID) | INTRAMUSCULAR | Status: DC | PRN

## 2018-06-07 MED ORDER — MIDAZOLAM HCL 2 MG/2ML IJ SOLN
INTRAMUSCULAR | Status: DC | PRN
  Administered 2018-06-07: 2 mg via INTRAVENOUS

## 2018-06-07 MED ORDER — NALBUPHINE HCL 10 MG/ML IJ SOLN
INTRAMUSCULAR | Status: AC
  Filled 2018-06-07: qty 1

## 2018-06-07 MED ORDER — SIMETHICONE 80 MG PO CHEW: 80.0000 mg | CHEWABLE_TABLET | ORAL | Status: DC | PRN

## 2018-06-07 MED ORDER — DIPHENHYDRAMINE HCL 25 MG PO CAPS: 25.0000 mg | ORAL_CAPSULE | ORAL | Status: DC | PRN

## 2018-06-07 MED ORDER — ONDANSETRON HCL 4 MG/2ML IJ SOLN
INTRAMUSCULAR | Status: DC | PRN
  Administered 2018-06-07: 4 mg via INTRAVENOUS

## 2018-06-07 MED ORDER — PHENYLEPHRINE HCL 10 MG/ML IJ SOLN
INTRAMUSCULAR | Status: DC | PRN
  Administered 2018-06-07 (×2): 80 ug via INTRAVENOUS

## 2018-06-07 MED ORDER — IBUPROFEN 600 MG PO TABS
600.0000 mg | ORAL_TABLET | Freq: Four times a day (QID) | ORAL | Status: DC
  Administered 2018-06-08 (×3): 600 mg via ORAL
  Filled 2018-06-07 (×3): qty 1

## 2018-06-07 MED ORDER — PRENATAL MULTIVITAMIN CH
1.0000 | ORAL_TABLET | Freq: Every day | ORAL | Status: DC
  Administered 2018-06-08: 1 via ORAL
  Filled 2018-06-07: qty 1

## 2018-06-07 MED ORDER — NALOXONE HCL 4 MG/10ML IJ SOLN: 1.0000 ug/kg/h | INTRAVENOUS | Status: DC | PRN

## 2018-06-07 MED ORDER — PROMETHAZINE HCL 25 MG/ML IJ SOLN: 6.2500 mg | INTRAMUSCULAR | Status: DC | PRN

## 2018-06-07 MED ORDER — NALBUPHINE HCL 10 MG/ML IJ SOLN: 5.0000 mg | Freq: Once | INTRAMUSCULAR | Status: AC | PRN

## 2018-06-07 MED ORDER — KETOROLAC TROMETHAMINE 30 MG/ML IJ SOLN
30.0000 mg | Freq: Four times a day (QID) | INTRAMUSCULAR | Status: DC | PRN
  Administered 2018-06-07: 30 mg via INTRAMUSCULAR

## 2018-06-07 MED ORDER — DIBUCAINE 1 % RE OINT
1.0000 | TOPICAL_OINTMENT | RECTAL | Status: DC | PRN
Start: 2018-06-07 — End: 2018-06-08

## 2018-06-07 MED ORDER — WITCH HAZEL-GLYCERIN EX PADS: 1.0000 "application " | MEDICATED_PAD | CUTANEOUS | Status: DC | PRN

## 2018-06-07 MED ORDER — SENNOSIDES-DOCUSATE SODIUM 8.6-50 MG PO TABS
2.0000 | ORAL_TABLET | ORAL | Status: DC
  Administered 2018-06-08: 2 via ORAL
  Filled 2018-06-07: qty 2

## 2018-06-07 SURGICAL SUPPLY — 21 items
CLOTH BEACON ORANGE TIMEOUT ST (SAFETY) ×2 IMPLANT
DRSG OPSITE POSTOP 3X4 (GAUZE/BANDAGES/DRESSINGS) ×2 IMPLANT
DURAPREP 26ML APPLICATOR (WOUND CARE) ×2 IMPLANT
GLOVE BIOGEL PI IND STRL 7.0 (GLOVE) ×2 IMPLANT
GLOVE BIOGEL PI INDICATOR 7.0 (GLOVE) ×2
GLOVE ECLIPSE 7.0 STRL STRAW (GLOVE) ×4 IMPLANT
GOWN STRL REUS W/TWL LRG LVL3 (GOWN DISPOSABLE) ×4 IMPLANT
NEEDLE HYPO 22GX1.5 SAFETY (NEEDLE) ×2 IMPLANT
NS IRRIG 1000ML POUR BTL (IV SOLUTION) ×2 IMPLANT
PACK ABDOMINAL MINOR (CUSTOM PROCEDURE TRAY) ×2 IMPLANT
PROTECTOR NERVE ULNAR (MISCELLANEOUS) ×2 IMPLANT
SPONGE LAP 4X18 X RAY DECT (DISPOSABLE) IMPLANT
SUT PLAIN 2 0 (SUTURE) ×4
SUT PLAIN ABS 2-0 CT1 27XMFL (SUTURE) IMPLANT
SUT VIC AB 0 CT1 27 (SUTURE) ×2
SUT VIC AB 0 CT1 27XBRD ANBCTR (SUTURE) ×1 IMPLANT
SUT VICRYL 4-0 PS2 18IN ABS (SUTURE) ×2 IMPLANT
SYR CONTROL 10ML LL (SYRINGE) ×2 IMPLANT
TOWEL OR 17X24 6PK STRL BLUE (TOWEL DISPOSABLE) ×4 IMPLANT
TRAY FOLEY CATH SILVER 14FR (SET/KITS/TRAYS/PACK) ×2 IMPLANT
WATER STERILE IRR 1000ML POUR (IV SOLUTION) ×2 IMPLANT

## 2018-06-07 NOTE — Progress Notes (Signed)
Patient cervix 6/75/-2. AROM performed with clear fluid. Patient tolerated procedure well. Risks of procedure were explained prior to it and patient was agreeable

## 2018-06-07 NOTE — Anesthesia Preprocedure Evaluation (Signed)
Anesthesia Evaluation  Patient identified by MRN, date of birth, ID band Patient awake    Reviewed: Allergy & Precautions, NPO status , Patient's Chart, lab work & pertinent test results  Airway Mallampati: III  TM Distance: >3 FB Neck ROM: Full    Dental  (+) Teeth Intact, Dental Advisory Given   Pulmonary    breath sounds clear to auscultation       Cardiovascular hypertension,  Rhythm:Regular Rate:Normal     Neuro/Psych negative neurological ROS  negative psych ROS   GI/Hepatic Neg liver ROS, GERD  Medicated,  Endo/Other  negative endocrine ROS  Renal/GU negative Renal ROS     Musculoskeletal   Abdominal (+) + obese,   Peds  Hematology negative hematology ROS (+)   Anesthesia Other Findings   Reproductive/Obstetrics (+) Pregnancy                             Anesthesia Physical  Anesthesia Plan  ASA: III  Anesthesia Plan: Epidural   Post-op Pain Management:    Induction: Intravenous  PONV Risk Score and Plan: Propofol infusion, Ondansetron and Treatment may vary due to age or medical condition  Airway Management Planned: Natural Airway  Additional Equipment: None  Intra-op Plan:   Post-operative Plan:   Informed Consent: I have reviewed the patients History and Physical, chart, labs and discussed the procedure including the risks, benefits and alternatives for the proposed anesthesia with the patient or authorized representative who has indicated his/her understanding and acceptance.     Plan Discussed with: CRNA  Anesthesia Plan Comments: (Lab Results      Component                Value               Date                      WBC                      8.9                 06/06/2018                HGB                      12.9                06/06/2018                HCT                      39.4                06/06/2018                MCV                      81.2                 06/06/2018                PLT                      270                 06/06/2018           )  Anesthesia Quick Evaluation  

## 2018-06-07 NOTE — Anesthesia Procedure Notes (Signed)
Epidural Patient location during procedure: OB Start time: 06/07/2018 8:15 AM End time: 06/07/2018 8:22 AM  Staffing Anesthesiologist: Shelton SilvasHollis, Jake Fuhrmann D, MD Performed: anesthesiologist   Preanesthetic Checklist Completed: patient identified, site marked, surgical consent, pre-op evaluation, timeout performed, IV checked, risks and benefits discussed and monitors and equipment checked  Epidural Patient position: sitting Prep: ChloraPrep Patient monitoring: heart rate, continuous pulse ox and blood pressure Approach: midline Location: L3-L4 Injection technique: LOR saline  Needle:  Needle type: Tuohy  Needle gauge: 17 G Needle length: 9 cm Catheter type: closed end flexible Catheter size: 20 Guage Test dose: negative and 1.5% lidocaine  Assessment Events: blood not aspirated, injection not painful, no injection resistance and no paresthesia  Additional Notes LOR @ 7.5  Patient identified. Risks/Benefits/Options discussed with patient including but not limited to bleeding, infection, nerve damage, paralysis, failed block, incomplete pain control, headache, blood pressure changes, nausea, vomiting, reactions to medications, itching and postpartum back pain. Confirmed with bedside nurse the patient's most recent platelet count. Confirmed with patient that they are not currently taking any anticoagulation, have any bleeding history or any family history of bleeding disorders. Patient expressed understanding and wished to proceed. All questions were answered. Sterile technique was used throughout the entire procedure. Please see nursing notes for vital signs. Test dose was given through epidural catheter and negative prior to continuing to dose epidural or start infusion. Warning signs of high block given to the patient including shortness of breath, tingling/numbness in hands, complete motor block, or any concerning symptoms with instructions to call for help. Patient was given instructions on  fall risk and not to get out of bed. All questions and concerns addressed with instructions to call with any issues or inadequate analgesia.    Reason for block:procedure for pain

## 2018-06-07 NOTE — Anesthesia Preprocedure Evaluation (Signed)
Anesthesia Evaluation  Patient identified by MRN, date of birth, ID band Patient awake    Reviewed: Allergy & Precautions, Patient's Chart, lab work & pertinent test results  Airway Mallampati: III  TM Distance: >3 FB Neck ROM: Full    Dental  (+) Teeth Intact, Dental Advisory Given   Pulmonary    breath sounds clear to auscultation       Cardiovascular hypertension,  Rhythm:Regular Rate:Normal     Neuro/Psych negative neurological ROS  negative psych ROS   GI/Hepatic Neg liver ROS, GERD  Medicated,  Endo/Other  negative endocrine ROS  Renal/GU negative Renal ROS     Musculoskeletal   Abdominal (+) + obese,   Peds  Hematology negative hematology ROS (+)   Anesthesia Other Findings   Reproductive/Obstetrics (+) Pregnancy                             Anesthesia Physical Anesthesia Plan  ASA: III  Anesthesia Plan: Epidural   Post-op Pain Management:    Induction:   PONV Risk Score and Plan:   Airway Management Planned: Natural Airway  Additional Equipment: None  Intra-op Plan:   Post-operative Plan:   Informed Consent: I have reviewed the patients History and Physical, chart, labs and discussed the procedure including the risks, benefits and alternatives for the proposed anesthesia with the patient or authorized representative who has indicated his/her understanding and acceptance.     Plan Discussed with:   Anesthesia Plan Comments: (Lab Results      Component                Value               Date                      WBC                      8.9                 06/06/2018                HGB                      12.9                06/06/2018                HCT                      39.4                06/06/2018                MCV                      81.2                06/06/2018                PLT                      270                 06/06/2018           )         Anesthesia Quick  Evaluation

## 2018-06-07 NOTE — Progress Notes (Signed)
Patient ID: Rondall AllegraRenida S Faul, female   DOB: 09/24/1986, 32 y.o.   MRN: [id number]  Patient is resting in bed.  Does not have any complaints.   Foley bulb is out.  Checked patient cervix.  She was 4cm 70% and -2 station. Contractions are 4-6 minutes apart and mildly painful.  Patient did have one strong contraction during exam.  Started pitocin.    FHT: 130bpm.  Moderate variability.  Few accels.  No decels.    BP (!) 156/91   Pulse 89   Temp 98.3 F (36.8 C) (Oral)   Resp 18   Ht 5\' 4"  (1.626 m)   Wt 108.4 kg   LMP 08/16/2017   BMI 41.02 kg/m   Dilation: 4 Effacement (%): 70 Cervical Position: Middle Station: -3 Presentation: Vertex Exam by:: Constance Goltzlson, MD

## 2018-06-07 NOTE — Progress Notes (Signed)
Vitals:   06/07/18 0600 06/07/18 0630  BP: 130/77 138/86  Pulse: 82 93  Resp: 18 16  Temp: 98.2 F (36.8 C)    FHR 135, mod variability, some 10X10 accels this hour, occ mild variable.  Ctx infrequent and mild. Pitocin at 1410mu/min.  Pt has had intense itching for hours (not hands/feet, mainly abdomen, arms, legs), last fentanyl at 2200. No rash. Doubt ICP. Nubain relieved it some, as well as benadryl.  Pt wants to try another antihistamine w/hopes of getting some rest. WIll try vistaril. Cx 4-5/50/-2.

## 2018-06-07 NOTE — Lactation Note (Signed)
This note was copied from a baby's chart. Lactation Consultation Note Baby 7 hrs old. Mom BF once after delivery and has formula fed ever since. Mom stated she is only going to formula feed. Third baby and doesn't wish to BF.  Engorgement discussed. Mom appreciative for information.  Patient Name: Anita Baldwin [id number]'s Date: 06/07/2018 Reason for consult: Initial assessment   Maternal Data    Feeding Feeding Type: Formula Nipple Type: Slow - flow  LATCH Score                   Interventions    Lactation Tools Discussed/Used     Consult Status Consult Status: Complete Date: 06/07/18    Charyl DancerCARVER, Omarion Minnehan G 06/07/2018, 9:13 PM

## 2018-06-07 NOTE — Anesthesia Postprocedure Evaluation (Signed)
Anesthesia Post Note  Patient: Tanyah S Robleto  Procedure(s) Performed: AN AD HOC LABOR EPIDURAL     Patient location during evaluation: Mother Baby Anesthesia Type: Epidural Level of consciousness: awake and alert Pain management: pain level controlled Vital Signs Assessment: post-procedure vital signs reviewed and stable Respiratory status: spontaneous breathing, nonlabored ventilation and respiratory function stable Cardiovascular status: stable Postop Assessment: no headache, no backache, epidural receding, able to ambulate, adequate PO intake, no apparent nausea or vomiting and patient able to bend at knees Anesthetic complications: no    Last Vitals:  Vitals:   06/07/18 1810 06/07/18 1930  BP: 125/76 121/76  Pulse: 81 90  Resp: 18 18  Temp: 37.6 C 37.8 C  SpO2: 98% 98%    Last Pain:  Vitals:   06/07/18 1930  TempSrc: Oral  PainSc:    Pain Goal: Patients Stated Pain Goal: 7 (06/07/18 0720)               Laban EmperorMalinova,Etter Royall Hristova

## 2018-06-07 NOTE — Interval H&P Note (Signed)
History and Physical Interval Note:  06/07/2018 3:36 PM  Anita Baldwin  has presented today for surgery following SVD, with the diagnosis of desires sterilization  The various methods of treatment have been discussed with the patient and family. After consideration of risks, benefits and other options for treatment, the patient has consented to  Procedure(s): POST PARTUM TUBAL LIGATION (Bilateral) as a surgical intervention .  The patient's history has been reviewed, patient examined, no change in status, stable for surgery.  I have reviewed the patient's chart and labs, patient counseled, r.e. Risks benefits of BTL, including permanency of procedure, risk of failure(1:100), increased risk of ectopic.  Patient verbalized understanding and desires to proceed. Questions were answered to the patient's satisfaction.     Reva Boresanya S Alasia Enge

## 2018-06-07 NOTE — Progress Notes (Signed)
  Subjective: Labor strip check  Objective: BP 126/87   Pulse 79   Temp 98.2 F (36.8 C) (Oral)   Resp 18   Ht 5\' 4"  (1.626 m)   Wt 108.4 kg   LMP 08/16/2017   SpO2 96%   BMI 41.02 kg/m  No intake/output data recorded. Total I/O In: -  Out: 150 [Urine:150]  FHT:  FHR: 145 bpm, variability: moderate,  accelerations:  Abscent,  decelerations:  Present early UC:   regular, every 2-3 minutes SVE:   Dilation: 6 Effacement (%): 70, 80 Station: -2 Exam by:: Dr. Truddie Cocoell  Labs: Lab Results  Component Value Date   WBC 8.9 06/06/2018   HGB 12.9 06/06/2018   HCT 39.4 06/06/2018   MCV 81.2 06/06/2018   PLT 270 06/06/2018    Assessment / Plan: Induction of labor due to non-reassuring fetal testing,  progressing well on pitocin.   Labor: Progressing on Pitocin Preeclampsia:  no signs or symptoms of toxicity Fetal Wellbeing:  Category I ROM: AROM, clear Pain Control:  Epidural I/D:  n/a Anticipated MOD:  NSVD   Anita SchatzPatricia Crystin Lechtenberg, DO Family Medicine, PGY-3

## 2018-06-07 NOTE — Transfer of Care (Signed)
Immediate Anesthesia Transfer of Care Note  Patient: Anita Baldwin  Procedure(s) Performed: POST PARTUM TUBAL LIGATION (Bilateral Abdomen)  Patient Location: PACU  Anesthesia Type:Epidural  Level of Consciousness: awake  Airway & Oxygen Therapy: Patient Spontanous Breathing  Post-op Assessment: Report given to RN and Post -op Vital signs reviewed and stable  Post vital signs: Reviewed and stable  Last Vitals:  Vitals Value Taken Time  BP    Temp    Pulse    Resp    SpO2      Last Pain:  Vitals:   06/07/18 1545  TempSrc:   PainSc: 0-No pain      Patients Stated Pain Goal: 7 (06/07/18 0720)  Complications: No apparent anesthesia complications

## 2018-06-07 NOTE — Op Note (Addendum)
Rondall AllegraRenida S Jacquin 06/07/2018 5:34 PM  PREOPERATIVE DIAGNOSIS:  Undesired fertility  POSTOPERATIVE DIAGNOSIS:  Undesired fertility  PROCEDURE:  Postpartum Bilateral Tubal Sterilization using Pomeroy method   SURGEON: Surgeon(s) and Role:    Reva Bores* Eyal Greenhaw S, MD - Primary    * Tamera StandsWallace, Laurel S, DO - Fellow - Attending  ANESTHESIA:  Epidural  COMPLICATIONS:  None immediate.  ESTIMATED BLOOD LOSS:  Less than 20cc.  FLUIDS: 200 cc LR.  URINE OUTPUT:  50 cc of clear urine.  INDICATIONS: 32 y.o. yo [id number]  with undesired fertility,status post vaginal delivery, desires permanent sterilization. Risks and benefits of procedure discussed with patient including permanence of method, bleeding, infection, injury to surrounding organs and need for additional procedures. Risk failure of 0.5-1% with increased risk of ectopic gestation if pregnancy occurs was also discussed with patient.   FINDINGS:  Normal uterus, tubes, and ovaries.  TECHNIQUE: After informed consent was obtained, the patient was taken to the operating room where anesthesia was induced and found to be adequate. Quarter percent Marcaine solution was then injected at the incision site.  A small transverse, infraumbilical skin incision was made with the scalpel. This incision was carried down to the underlying layer of fascia. The fascia was entered sharply with the knife. The peritoneum was tented up and entered sharply with the knife. The patient's left fallopian tube was then identified, brought to the incision, and grasped with a Babcock clamp. The tube was then followed out to the fimbria. The Babcock clamp was then used to grasp the tube approximately 4 cm from the cornual region. A 3 cm segment of the tube was then ligated with free tie of plain gut suture, suture was wrapped around entire portion of tube a second time prior to securing knot. An additional tie was placed below the level of the first tie. The tube was then transected  and excised with visualization of ends of tube in segment. Good hemostasis was noted and the tube was returned to the abdomen. The right fallopian tube was then identified to its fimbriated end, ligated, and a 3 cm segment excised in a similar fashion. Excellent hemostasis was noted, and the tube returned to the abdomen. The fascia was re-approximated with 0 Vicryl. The skin was closed in a subcuticular fashion with 3-0 Vicryl. The patient tolerated the procedure well. Sponge, lap, and needle count were correct x 2. The patient was taken to recovery room in stable condition.  Rhett BannisterLaurel Wallace, DO OB Fellow  06/07/2018, 5:34 PM

## 2018-06-07 NOTE — Anesthesia Postprocedure Evaluation (Signed)
Anesthesia Post Note  Patient: Anita Baldwin  Procedure(s) Performed: POST PARTUM TUBAL LIGATION (Bilateral Abdomen)     Patient location during evaluation: PACU Anesthesia Type: Epidural Level of consciousness: oriented and awake and alert Pain management: pain level controlled Vital Signs Assessment: post-procedure vital signs reviewed and stable Respiratory status: spontaneous breathing, respiratory function stable and patient connected to nasal cannula oxygen Cardiovascular status: blood pressure returned to baseline and stable Postop Assessment: no headache, no backache, no apparent nausea or vomiting, epidural receding and patient able to bend at knees Anesthetic complications: no    Last Vitals:  Vitals:   06/07/18 1719 06/07/18 1720  BP:    Pulse: 95 98  Resp: 15 15  Temp:    SpO2: 98% 98%    Last Pain:  Vitals:   06/07/18 1715  TempSrc:   PainSc: Asleep   Pain Goal: Patients Stated Pain Goal: 7 (06/07/18 0720)               Shelton SilvasKevin D Hollis

## 2018-06-08 ENCOUNTER — Encounter (HOSPITAL_COMMUNITY): Payer: Self-pay | Admitting: Family Medicine

## 2018-06-08 LAB — CBC
HEMATOCRIT: 34.4 % — AB (ref 36.0–46.0)
Hemoglobin: 11.3 g/dL — ABNORMAL LOW (ref 12.0–15.0)
MCH: 26.5 pg (ref 26.0–34.0)
MCHC: 32.8 g/dL (ref 30.0–36.0)
MCV: 80.8 fL (ref 78.0–100.0)
PLATELETS: 228 10*3/uL (ref 150–400)
RBC: 4.26 MIL/uL (ref 3.87–5.11)
RDW: 16.7 % — AB (ref 11.5–15.5)
WBC: 9.5 10*3/uL (ref 4.0–10.5)

## 2018-06-08 MED ORDER — IBUPROFEN 600 MG PO TABS
600.0000 mg | ORAL_TABLET | Freq: Four times a day (QID) | ORAL | 0 refills | Status: AC
Start: 2018-06-08 — End: ?

## 2018-06-08 NOTE — Discharge Instructions (Signed)
Vaginal Delivery, Care After °Refer to this sheet in the next few weeks. These instructions provide you with information about caring for yourself after vaginal delivery. Your health care provider may also give you more specific instructions. Your treatment has been planned according to current medical practices, but problems sometimes occur. Call your health care provider if you have any problems or questions. °What can I expect after the procedure? °After vaginal delivery, it is common to have: °· Some bleeding from your vagina. °· Soreness in your abdomen, your vagina, and the area of skin between your vaginal opening and your anus (perineum). °· Pelvic cramps. °· Fatigue. ° °Follow these instructions at home: °Medicines °· Take over-the-counter and prescription medicines only as told by your health care provider. °· If you were prescribed an antibiotic medicine, take it as told by your health care provider. Do not stop taking the antibiotic until it is finished. °Driving ° °· Do not drive or operate heavy machinery while taking prescription pain medicine. °· Do not drive for 24 hours if you received a sedative. °Lifestyle °· Do not drink alcohol. This is especially important if you are breastfeeding or taking medicine to relieve pain. °· Do not use tobacco products, including cigarettes, chewing tobacco, or e-cigarettes. If you need help quitting, ask your health care provider. °Eating and drinking °· Drink at least 8 eight-ounce glasses of water every day unless you are told not to by your health care provider. If you choose to breastfeed your baby, you may need to drink more water than this. °· Eat high-fiber foods every day. These foods may help prevent or relieve constipation. High-fiber foods include: °? Whole grain cereals and breads. °? Brown rice. °? Beans. °? Fresh fruits and vegetables. °Activity °· Return to your normal activities as told by your health care provider. Ask your health care provider  what activities are safe for you. °· Rest as much as possible. Try to rest or take a nap when your baby is sleeping. °· Do not lift anything that is heavier than your baby or 10 lb (4.5 kg) until your health care provider says that it is safe. °· Talk with your health care provider about when you can engage in sexual activity. This may depend on your: °? Risk of infection. °? Rate of healing. °? Comfort and desire to engage in sexual activity. °Vaginal Care °· If you have an episiotomy or a vaginal tear, check the area every day for signs of infection. Check for: °? More redness, swelling, or pain. °? More fluid or blood. °? Warmth. °? Pus or a bad smell. °· Do not use tampons or douches until your health care provider says this is safe. °· Watch for any blood clots that may pass from your vagina. These may look like clumps of dark red, brown, or black discharge. °General instructions °· Keep your perineum clean and dry as told by your health care provider. °· Wear loose, comfortable clothing. °· Wipe from front to back when you use the toilet. °· Ask your health care provider if you can shower or take a bath. If you had an episiotomy or a perineal tear during labor and delivery, your health care provider may tell you not to take baths for a certain length of time. °· Wear a bra that supports your breasts and fits you well. °· If possible, have someone help you with household activities and help care for your baby for at least a few days after   you leave the hospital. °· Keep all follow-up visits for you and your baby as told by your health care provider. This is important. °Contact a health care provider if: °· You have: °? Vaginal discharge that has a bad smell. °? Difficulty urinating. °? Pain when urinating. °? A sudden increase or decrease in the frequency of your bowel movements. °? More redness, swelling, or pain around your episiotomy or vaginal tear. °? More fluid or blood coming from your episiotomy or  vaginal tear. °? Pus or a bad smell coming from your episiotomy or vaginal tear. °? A fever. °? A rash. °? Little or no interest in activities you used to enjoy. °? Questions about caring for yourself or your baby. °· Your episiotomy or vaginal tear feels warm to the touch. °· Your episiotomy or vaginal tear is separating or does not appear to be healing. °· Your breasts are painful, hard, or turn red. °· You feel unusually sad or worried. °· You feel nauseous or you vomit. °· You pass large blood clots from your vagina. If you pass a blood clot from your vagina, save it to show to your health care provider. Do not flush blood clots down the toilet without having your health care provider look at them. °· You urinate more than usual. °· You are dizzy or light-headed. °· You have not breastfed at all and you have not had a menstrual period for 12 weeks after delivery. °· You have stopped breastfeeding and you have not had a menstrual period for 12 weeks after you stopped breastfeeding. °Get help right away if: °· You have: °? Pain that does not go away or does not get better with medicine. °? Chest pain. °? Difficulty breathing. °? Blurred vision or spots in your vision. °? Thoughts about hurting yourself or your baby. °· You develop pain in your abdomen or in one of your legs. °· You develop a severe headache. °· You faint. °· You bleed from your vagina so much that you fill two sanitary pads in one hour. °This information is not intended to replace advice given to you by your health care provider. Make sure you discuss any questions you have with your health care provider. °Document Released: 10/13/2000 Document Revised: 03/29/2016 Document Reviewed: 10/31/2015 °Elsevier Interactive Patient Education © 2018 [employer] ° °

## 2018-06-08 NOTE — Discharge Summary (Signed)
OB Discharge Summary     Patient Name: Anita Baldwin DOB: 02/28/1986 MRN: [id number]  Date of admission: 06/06/2018 Delivering MD: Tamera Stands   Date of discharge: 06/08/2018  Admitting diagnosis: INDUCTION Intrauterine pregnancy: [redacted]w[redacted]d     Secondary diagnosis:  Active Problems:   Post term pregnancy over 40 weeks  Additional problems: nonreactive NST     Discharge diagnosis: Term Pregnancy Delivered                                                                                                Post partum procedures:postpartum tubal ligation  Augmentation: AROM, Pitocin, Cytotec and Foley Balloon  Complications: None  Hospital course:  Induction of Labor With Vaginal Delivery   32 y.o. yo G3P3003 at [redacted]w[redacted]d was admitted to the hospital 06/06/2018 for induction of labor.  Indication for induction: Non reactive NST.  Patient had an uncomplicated labor course as follows: Membrane Rupture Time/Date: 9:52 AM ,06/07/2018   Intrapartum Procedures: Episiotomy: None [1]                                         Lacerations:  None [1]  Patient had delivery of a Viable infant.  Information for the patient's newborn:  Amely, [place] Girl Aysel [[id number]]  Delivery Method: Vaginal, Spontaneous(Filed from Delivery Summary)   06/07/2018  Details of delivery can be found in separate delivery note.  Patient had a routine postpartum course. Patient is discharged home 06/08/18.  Physical exam  Vitals:   06/07/18 2045 06/07/18 2215 06/07/18 2315 06/08/18 0357  BP: 116/68 135/75 124/75 119/76  Pulse: (!) 115 (!) 112 (!) 104 86  Resp: 20 18 19 16   Temp: 99.4 F (37.4 C) 98.7 F (37.1 C) 98.6 F (37 C) 98.5 F (36.9 C)  TempSrc: Oral Oral Oral Oral  SpO2:  98% 97% 97%  Weight:      Height:       General: alert, cooperative and no distress Lochia: appropriate Uterine Fundus: firm Incision: Healing well with no significant drainage DVT Evaluation: No evidence of DVT seen on physical  exam. Labs: Lab Results  Component Value Date   WBC 9.5 06/08/2018   HGB 11.3 (L) 06/08/2018   HCT 34.4 (L) 06/08/2018   MCV 80.8 06/08/2018   PLT 228 06/08/2018   CMP Latest Ref Rng & Units 03/17/2009  Glucose 70 - 99 mg/dL 82  BUN 6 - 23 mg/dL 14  Creatinine 0.4 - 1.2 mg/dL 1.1  Sodium 782 - 956 mEq/L 140  Potassium 3.5 - 5.1 mEq/L 3.7  Chloride 96 - 112 mEq/L 103    Discharge instruction: per After Visit Summary and "Baby and Me Booklet".  After visit meds:  Allergies as of 06/08/2018   No Known Allergies     Medication List    TAKE these medications   ibuprofen 600 MG tablet Commonly known as:  ADVIL,MOTRIN Take 1 tablet (600 mg total) by mouth every 6 (six) hours.   loratadine 10 MG tablet  Commonly known as:  CLARITIN Take 1 tablet (10 mg total) by mouth daily.   PRENATAL GUMMIES/DHA & FA 0.4-32.5 MG Chew Chew 1 tablet by mouth daily.   ranitidine 150 MG tablet Commonly known as:  ZANTAC Take 1 tablet (150 mg total) by mouth 2 (two) times daily.       Diet: routine diet  Activity: Advance as tolerated. Pelvic rest for 6 weeks.   Outpatient follow up:4 weeks Follow up Appt:No future appointments. Follow up Visit:No follow-ups on file.  Postpartum contraception: Tubal Ligation  Newborn Data: Live born female  Birth Weight: 7 lb 14.6 oz (3589 g) APGAR: 9, 9  Newborn Delivery   Birth date/time:  06/07/2018 13:28:00 Delivery type:  Vaginal, Spontaneous     Baby Feeding: Bottle and Breast Disposition:home with mother   06/08/2018 Sandre Kittyaniel K Olson, MD

## 2018-06-10 ENCOUNTER — Encounter (HOSPITAL_COMMUNITY): Payer: Self-pay

## 2018-06-17 ENCOUNTER — Encounter: Payer: Medicaid Other | Admitting: Obstetrics and Gynecology

## 2018-07-03 ENCOUNTER — Encounter: Payer: Self-pay | Admitting: Obstetrics & Gynecology

## 2018-07-03 ENCOUNTER — Ambulatory Visit (INDEPENDENT_AMBULATORY_CARE_PROVIDER_SITE_OTHER): Payer: Medicaid Other | Admitting: Obstetrics & Gynecology

## 2018-07-03 DIAGNOSIS — Z1389 Encounter for screening for other disorder: Secondary | ICD-10-CM

## 2018-07-03 NOTE — Progress Notes (Signed)
Post Partum Exam  Anita Baldwin is a 32 y.o. G4P3003 female who presents for a postpartum visit. She is 4 weeks postpartum following a spontaneous vaginal delivery. I have fully reviewed the prenatal and intrapartum course. The delivery was at 41 gestational weeks.  Anesthesia: epidural. Postpartum course has been well. Baby's course has been well. Baby is feeding by bottle - Lucien Mons Start . Bleeding staining only. Bowel function is normal. Bladder function is normal. Patient is not sexually active. Contraception method is tubal ligation. Postpartum depression screening:neg  The following portions of the patient's history were reviewed and updated as appropriate: allergies, current medications, past family history, past medical history, past social history, past surgical history and problem list. Last pap smear done 11/06/2017 and was Normal  Review of Systems Pertinent items are noted in HPI.    Objective:  unknown if currently breastfeeding.  General:  alert, cooperative and no distress   Breasts:     Lungs: effort normal  Heart:  nl rate  Abdomen: soft, non-tender; bowel sounds normal; no masses,  no organomegaly   Vulva:  not evaluated  Vagina: not evaluated   Assessment:    normal postpartum exam. Pap smear not done at today's visit.  S/P BTL, mild postop RLQ pain well controlled Plan:   1. Contraception: Depo-Provera injections and tubal ligation 2. Ibuprofen for pain 3. Follow up as needed.   Adam Phenix, MD 07/03/2018

## 2018-07-03 NOTE — Patient Instructions (Signed)
Postpartum Care After Vaginal Delivery °The period of time right after you deliver your newborn is called the postpartum period. °What kind of medical care will I receive? °· You may continue to receive fluids and medicines through an IV tube inserted into one of your veins. °· If an incision was made near your vagina (episiotomy) or if you had some vaginal tearing during delivery, cold compresses may be placed on your episiotomy or your tear. This helps to reduce pain and swelling. °· You may be given a squirt bottle to use when you go to the bathroom. You may use this until you are comfortable wiping as usual. To use the squirt bottle, follow these steps: °? Before you urinate, fill the squirt bottle with warm water. Do not use hot water. °? After you urinate, while you are sitting on the toilet, use the squirt bottle to rinse the area around your urethra and vaginal opening. This rinses away any urine and blood. °? You may do this instead of wiping. As you start healing, you may use the squirt bottle before wiping yourself. Make sure to wipe gently. °? Fill the squirt bottle with clean water every time you use the bathroom. °· You will be given sanitary pads to wear. °How can I expect to feel? °· You may not feel the need to urinate for several hours after delivery. °· You will have some soreness and pain in your abdomen and vagina. °· If you are breastfeeding, you may have uterine contractions every time you breastfeed for up to several weeks postpartum. Uterine contractions help your uterus return to its normal size. °· It is normal to have vaginal bleeding (lochia) after delivery. The amount and appearance of lochia is often similar to a menstrual period in the first week after delivery. It will gradually decrease over the next few weeks to a dry, yellow-brown discharge. For most women, lochia stops completely by 6-8 weeks after delivery. Vaginal bleeding can vary from woman to woman. °· Within the first few  days after delivery, you may have breast engorgement. This is when your breasts feel heavy, full, and uncomfortable. Your breasts may also throb and feel hard, tightly stretched, warm, and tender. After this occurs, you may have milk leaking from your breasts. Your health care provider can help you relieve discomfort due to breast engorgement. Breast engorgement should go away within a few days. °· You may feel more sad or worried than normal due to hormonal changes after delivery. These feelings should not last more than a few days. If these feelings do not go away after several days, speak with your health care provider. °How should I care for myself? °· Tell your health care provider if you have pain or discomfort. °· Drink enough water to keep your urine clear or pale yellow. °· Wash your hands thoroughly with soap and water for at least 20 seconds after changing your sanitary pads, after using the toilet, and before holding or feeding your baby. °· If you are not breastfeeding, avoid touching your breasts a lot. Doing this can make your breasts produce more milk. °· If you become weak or lightheaded, or you feel like you might faint, ask for help before: °? Getting out of bed. °? Showering. °· Change your sanitary pads frequently. Watch for any changes in your flow, such as a sudden increase in volume, a change in color, the passing of large blood clots. If you pass a blood clot from your vagina, save it   to show to your health care provider. Do not flush blood clots down the toilet without having your health care provider look at them. °· Make sure that all your vaccinations are up to date. This can help protect you and your baby from getting certain diseases. You may need to have immunizations done before you leave the hospital. °· If desired, talk with your health care provider about methods of family planning or birth control (contraception). °How can I start bonding with my baby? °Spending as much time as  possible with your baby is very important. During this time, you and your baby can get to know each other and develop a bond. Having your baby stay with you in your room (rooming in) can give you time to get to know your baby. Rooming in can also help you become comfortable caring for your baby. Breastfeeding can also help you bond with your baby. °How can I plan for returning home with my baby? °· Make sure that you have a car seat installed in your vehicle. °? Your car seat should be checked by a certified car seat installer to make sure that it is installed safely. °? Make sure that your baby fits into the car seat safely. °· Ask your health care provider any questions you have about caring for yourself or your baby. Make sure that you are able to contact your health care provider with any questions after leaving the hospital. °This information is not intended to replace advice given to you by your health care provider. Make sure you discuss any questions you have with your health care provider. °Document Released: 08/13/2007 Document Revised: 03/20/2016 Document Reviewed: 09/20/2015 °Elsevier Interactive Patient Education © 2018 Elsevier Inc. ° °

## 2020-05-31 ENCOUNTER — Ambulatory Visit (INDEPENDENT_AMBULATORY_CARE_PROVIDER_SITE_OTHER): Payer: Medicaid Other | Admitting: Obstetrics

## 2020-05-31 ENCOUNTER — Other Ambulatory Visit (HOSPITAL_COMMUNITY)
Admission: RE | Admit: 2020-05-31 | Discharge: 2020-05-31 | Disposition: A | Discharge status: Home / Self Care | Payer: Medicaid Other | Source: Ambulatory Visit | Attending: Obstetrics | Admitting: Obstetrics

## 2020-05-31 ENCOUNTER — Encounter: Payer: Self-pay | Admitting: Obstetrics

## 2020-05-31 ENCOUNTER — Other Ambulatory Visit: Payer: Self-pay

## 2020-05-31 VITALS — BP 156/107 | HR 97 | Ht 64.0 in | Wt 210.4 lb

## 2020-05-31 DIAGNOSIS — N944 Primary dysmenorrhea: Secondary | ICD-10-CM | POA: Diagnosis not present

## 2020-05-31 DIAGNOSIS — Z01419 Encounter for gynecological examination (general) (routine) without abnormal findings: Secondary | ICD-10-CM

## 2020-05-31 DIAGNOSIS — Z113 Encounter for screening for infections with a predominantly sexual mode of transmission: Secondary | ICD-10-CM

## 2020-05-31 DIAGNOSIS — Z1151 Encounter for screening for human papillomavirus (HPV): Secondary | ICD-10-CM

## 2020-05-31 DIAGNOSIS — Z01411 Encounter for gynecological examination (general) (routine) with abnormal findings: Secondary | ICD-10-CM | POA: Diagnosis not present

## 2020-05-31 DIAGNOSIS — B373 Candidiasis of vulva and vagina: Secondary | ICD-10-CM | POA: Diagnosis not present

## 2020-05-31 DIAGNOSIS — Z9851 Tubal ligation status: Secondary | ICD-10-CM

## 2020-05-31 DIAGNOSIS — N898 Other specified noninflammatory disorders of vagina: Secondary | ICD-10-CM

## 2020-05-31 DIAGNOSIS — J301 Allergic rhinitis due to pollen: Secondary | ICD-10-CM | POA: Diagnosis not present

## 2020-05-31 MED ORDER — LORATADINE 10 MG PO TABS: 10.0000 mg | ORAL_TABLET | Freq: Every day | ORAL | 11 refills | Status: AC

## 2020-05-31 MED ORDER — IBUPROFEN 800 MG PO TABS: 800.0000 mg | ORAL_TABLET | Freq: Three times a day (TID) | ORAL | 5 refills | Status: AC | PRN

## 2020-05-31 NOTE — Addendum Note (Signed)
Addended by: Hamilton Capri on: 05/31/2020 03:43 PM   Modules accepted: Orders

## 2020-05-31 NOTE — Addendum Note (Signed)
Addended by: Coral Ceo A on: 05/31/2020 03:42 PM   Modules accepted: Orders

## 2020-05-31 NOTE — Progress Notes (Signed)
Patient presents for AEX. Patient has no concerns.  Last pap: 11/06/2017 Normal

## 2020-05-31 NOTE — Addendum Note (Signed)
Addended by: Hamilton Capri on: 05/31/2020 03:30 PM   Modules accepted: Orders

## 2020-05-31 NOTE — Progress Notes (Signed)
Subjective:        Anita Baldwin is a 34 y.o. female here for a routine exam.  Current complaints: None.    Personal health questionnaire:  Is patient [name], have a family history of breast and/or ovarian cancer: no Is there a family history of uterine cancer diagnosed at age < 29, gastrointestinal cancer, urinary tract cancer, family member who is a Personnel officer syndrome-associated carrier: no Is the patient overweight and hypertensive, family history of diabetes, personal history of gestational diabetes, preeclampsia or PCOS: no Is patient over 51, have PCOS,  family history of premature CHD under age 57, diabetes, smoke, have hypertension or peripheral artery disease:  no At any time, has a partner hit, kicked or otherwise hurt or frightened you?: no Over the past 2 weeks, have you felt down, depressed or hopeless?: no Over the past 2 weeks, have you felt little interest or pleasure in doing things?:no   Gynecologic History Patient's last menstrual period was 05/08/2020. Contraception: tubal ligation Last Pap: 11-06-2017. Results were: normal Last mammogram: n/a. Results were: n/a  Obstetric History OB History  Gravida Para Term Preterm AB Living  3 3 3  0 0 3  SAB TAB Ectopic Multiple Live Births  0 0 0 0 3    # Outcome Date GA Lbr Len/2nd Weight Sex Delivery Anes PTL Lv  3 Term 06/07/18 [redacted]w[redacted]d 03:02 / 00:34 7 lb 14.6 oz (3.589 kg) F Vag-Spont EPI  LIV     Birth Comments: WNL  2 Term 07/18/12 [redacted]w[redacted]d 24:14 / 01:28 8 lb 4.1 oz (3.745 kg) M Vag-Spont EPI  LIV     Birth Comments: WDL  1 Term 2006 [redacted]w[redacted]d 48:00 7 lb 6 oz (3.345 kg) M Vag-Spont EPI  LIV    Past Medical History:  Diagnosis Date   Medical history non-contributory    No pertinent past medical history     Past Surgical History:  Procedure Laterality Date   NO PAST SURGERIES     TUBAL LIGATION Bilateral 06/07/2018   Procedure: POST PARTUM TUBAL LIGATION;  Surgeon: 08/07/2018, MD;  Location: [facility]  BIRTHING SUITES;  Service: Gynecology;  Laterality: Bilateral;     Current Outpatient Medications:    loratadine (CLARITIN) 10 MG tablet, Take 1 tablet (10 mg total) by mouth daily., Disp: 30 tablet, Rfl: 11   ibuprofen (ADVIL,MOTRIN) 600 MG tablet, Take 1 tablet (600 mg total) by mouth every 6 (six) hours. (Patient not taking: Reported on 05/31/2020), Disp: 30 tablet, Rfl: 0   ranitidine (ZANTAC) 150 MG tablet, Take 1 tablet (150 mg total) by mouth 2 (two) times daily. (Patient not taking: Reported on 07/03/2018), Disp: 60 tablet, Rfl: 5 No Known Allergies  Social History   Tobacco Use   Smoking status: Never Smoker   Smokeless tobacco: Never Used  Substance Use Topics   Alcohol use: No    Alcohol/week: 0.0 standard drinks    Family History  Problem Relation Age of Onset   Diabetes Sister    Hypertension Maternal Grandfather    Hypertension Paternal Grandmother    Cancer Paternal Grandmother        breast   Diabetes Paternal Grandmother    Cancer Paternal Aunt       Review of Systems  Constitutional: negative for fatigue and weight loss Respiratory: negative for cough and wheezing Cardiovascular: negative for chest pain, fatigue and palpitations Gastrointestinal: negative for abdominal pain and change in bowel habits Musculoskeletal:negative for myalgias Neurological: negative for gait  problems and tremors Behavioral/Psych: negative for abusive relationship, depression Endocrine: negative for temperature intolerance    Genitourinary:negative for abnormal menstrual periods, genital lesions, hot flashes, sexual problems and vaginal discharge Integument/breast: negative for breast lump, breast tenderness, nipple discharge and skin lesion(s)    Objective:       BP (!) 156/107    Pulse 97    Ht 5\' 4"  (1.626 m)    Wt (!) 210 lb 6.4 oz (95.4 kg)    LMP 05/08/2020    BMI 36.12 kg/m  General:   alert and no distress  Skin:   no rash or abnormalities  Lungs:   clear  to auscultation bilaterally  Heart:   regular rate and rhythm, S1, S2 normal, no murmur, click, rub or gallop  Breasts:   normal without suspicious masses, skin or nipple changes or axillary nodes  Abdomen:  normal findings: no organomegaly, soft, non-tender and no hernia  Pelvis:  External genitalia: normal general appearance Urinary system: urethral meatus normal and bladder without fullness, nontender Vaginal: normal without tenderness, induration or masses Cervix: normal appearance Adnexa: normal bimanual exam Uterus: anteverted and non-tender, normal size   Lab Review Urine pregnancy test Labs reviewed yes Radiologic studies reviewed no  50% of 20 min visit spent on counseling and coordination of care.   Assessment:     1. Encounter for gynecological examination with Papanicolaou smear of cervix Rx: - Cytology - PAP( [place])  2. Vaginal discharge Rx: - Cervicovaginal ancillary only( [place])  3. Screen for STD (sexually transmitted disease) Rx: - Hepatitis B surface antigen - Hepatitis C antibody - HIV Antibody (routine testing w rflx) - RPR  4. Primary dysmenorrhea Rx: - ibuprofen (ADVIL) 800 MG tablet; Take 1 tablet (800 mg total) by mouth every 8 (eight) hours as needed.  Dispense: 30 tablet; Refill: 5  5. Seasonal allergic rhinitis due to pollen Rx: - loratadine (CLARITIN) 10 MG tablet; Take 1 tablet (10 mg total) by mouth daily.  Dispense: 30 tablet; Refill: 11    Plan:    Education reviewed: calcium supplements, depression evaluation, low fat, low cholesterol diet, safe sex/STD prevention, self breast exams and weight bearing exercise. Follow up in: 1 year.   No orders of the defined types were placed in this encounter.  Orders Placed This Encounter  Procedures   Hepatitis B surface antigen   Hepatitis C antibody   HIV Antibody (routine testing w rflx)   RPR    07/09/2020, MD  05/31/2020 9:07 AM

## 2020-06-01 ENCOUNTER — Other Ambulatory Visit: Payer: Self-pay | Admitting: Obstetrics

## 2020-06-01 ENCOUNTER — Telehealth: Payer: Self-pay

## 2020-06-01 DIAGNOSIS — B373 Candidiasis of vulva and vagina: Secondary | ICD-10-CM

## 2020-06-01 DIAGNOSIS — B3731 Acute candidiasis of vulva and vagina: Secondary | ICD-10-CM

## 2020-06-01 LAB — CERVICOVAGINAL ANCILLARY ONLY
Bacterial Vaginitis (gardnerella): NEGATIVE
Candida Glabrata: NEGATIVE
Candida Vaginitis: POSITIVE — AB
Chlamydia: NEGATIVE
Comment: NEGATIVE
Comment: NEGATIVE
Comment: NEGATIVE
Comment: NEGATIVE
Comment: NEGATIVE
Comment: NORMAL
Neisseria Gonorrhea: NEGATIVE
Trichomonas: NEGATIVE

## 2020-06-01 LAB — CYTOLOGY - PAP
Comment: NEGATIVE
Diagnosis: NEGATIVE
High risk HPV: NEGATIVE

## 2020-06-01 LAB — RPR: RPR Ser Ql: NONREACTIVE

## 2020-06-01 LAB — HIV ANTIBODY (ROUTINE TESTING W REFLEX): HIV Screen 4th Generation wRfx: NONREACTIVE

## 2020-06-01 LAB — HEPATITIS C ANTIBODY: Hep C Virus Ab: <0.1 s/co ratio (ref 0.0–0.9)

## 2020-06-01 LAB — HEPATITIS B SURFACE ANTIGEN: Hepatitis B Surface Ag: NEGATIVE

## 2020-06-01 MED ORDER — FLUCONAZOLE 150 MG PO TABS: 150.0000 mg | ORAL_TABLET | Freq: Once | ORAL | 0 refills | Status: AC

## 2020-06-01 NOTE — Telephone Encounter (Signed)
-----   Message from [name], MD sent at 06/01/2020  1:46 PM EDT ----- Diflucan Rx for yeast.

## 2020-06-01 NOTE — Telephone Encounter (Signed)
Called pt regarding positive yeast results. Pt made aware that Diflucan was sent to her pharmacy. Understanding was voiced.  Markeda Narvaez l Korene Dula, CMA

## 2022-02-15 ENCOUNTER — Ambulatory Visit: Payer: Medicaid Other | Admitting: Obstetrics

## 2022-03-24 ENCOUNTER — Ambulatory Visit (INDEPENDENT_AMBULATORY_CARE_PROVIDER_SITE_OTHER): Payer: Commercial Managed Care - HMO | Admitting: Obstetrics and Gynecology

## 2022-03-24 ENCOUNTER — Encounter: Payer: Self-pay | Admitting: Obstetrics and Gynecology

## 2022-03-24 ENCOUNTER — Other Ambulatory Visit (HOSPITAL_COMMUNITY)
Admission: RE | Admit: 2022-03-24 | Discharge: 2022-03-24 | Disposition: A | Discharge status: Home / Self Care | Payer: Commercial Managed Care - HMO | Source: Ambulatory Visit | Attending: Obstetrics and Gynecology | Admitting: Obstetrics and Gynecology

## 2022-03-24 VITALS — BP 168/104 | HR 70 | Ht 64.0 in | Wt 170.0 lb

## 2022-03-24 DIAGNOSIS — Z01419 Encounter for gynecological examination (general) (routine) without abnormal findings: Secondary | ICD-10-CM | POA: Diagnosis not present

## 2022-03-24 DIAGNOSIS — N920 Excessive and frequent menstruation with regular cycle: Secondary | ICD-10-CM

## 2022-03-24 DIAGNOSIS — Z113 Encounter for screening for infections with a predominantly sexual mode of transmission: Secondary | ICD-10-CM

## 2022-03-24 NOTE — Progress Notes (Signed)
GYNECOLOGY ANNUAL PREVENTATIVE CARE ENCOUNTER NOTE  History:     Nelissa ANAELLE VATER is a 36 y.o. G34P3003 female here for a routine annual gynecologic exam.  Current complaints: heavy regular menses.   Denies discharge, pelvic pain, problems with intercourse or other gynecologic concerns.   Menses last 5-7 days, usually heavy all cycle. She does pass clots occasionally. Pt uses 4-6 pads on the heaviest day.  Pt denies fatigue.   Gynecologic History Patient's last menstrual period was 02/24/2022 (exact date). Contraception: tubal ligation Last Pap: 05/31/20. Results were: normal with negative HPV   Obstetric History OB History  Gravida Para Term Preterm AB Living  3 3 3  0 0 3  SAB IAB Ectopic Multiple Live Births  0 0 0 0 3    # Outcome Date GA Lbr Len/2nd Weight Sex Delivery Anes PTL Lv  3 Term 06/07/18 [redacted]w[redacted]d 03:02 / 00:34 7 lb 14.6 oz (3.589 kg) F Vag-Spont EPI  LIV     Birth Comments: WNL  2 Term 07/18/12 [redacted]w[redacted]d 24:14 / 01:28 8 lb 4.1 oz (3.745 kg) M Vag-Spont EPI  LIV     Birth Comments: WDL  1 Term 2006 [redacted]w[redacted]d 48:00 7 lb 6 oz (3.345 kg) M Vag-Spont EPI  LIV    Past Medical History:  Diagnosis Date   Medical history non-contributory    No pertinent past medical history     Past Surgical History:  Procedure Laterality Date   NO PAST SURGERIES     TUBAL LIGATION Bilateral 06/07/2018   Procedure: POST PARTUM TUBAL LIGATION;  Surgeon: Donnamae Jude, MD;  Location: [place];  Service: Gynecology;  Laterality: Bilateral;    Current Outpatient Medications on File Prior to Visit  Medication Sig Dispense Refill   ibuprofen (ADVIL) 800 MG tablet Take 1 tablet (800 mg total) by mouth every 8 (eight) hours as needed. 30 tablet 5   ibuprofen (ADVIL,MOTRIN) 600 MG tablet Take 1 tablet (600 mg total) by mouth every 6 (six) hours. (Patient not taking: Reported on 05/31/2020) 30 tablet 0   loratadine (CLARITIN) 10 MG tablet Take 1 tablet (10 mg total) by mouth daily. 30 tablet 11    ranitidine (ZANTAC) 150 MG tablet Take 1 tablet (150 mg total) by mouth 2 (two) times daily. (Patient not taking: Reported on 07/03/2018) 60 tablet 5   No current facility-administered medications on file prior to visit.    No Known Allergies  Social History:  reports that she has never smoked. She has never used smokeless tobacco. She reports that she does not drink alcohol and does not use drugs.  Family History  Problem Relation Age of Onset   Diabetes Sister    Hypertension Maternal Grandfather    Hypertension Paternal Grandmother    Cancer Paternal Grandmother        breast   Diabetes Paternal Grandmother    Cancer Paternal Aunt     The following portions of the patient's history were reviewed and updated as appropriate: allergies, current medications, past family history, past medical history, past social history, past surgical history and problem list.  Review of Systems Pertinent items noted in HPI and remainder of comprehensive ROS otherwise negative.  Physical Exam:  BP (!) 158/102   Pulse 77   Ht 5\' 4"  (1.626 m)   Wt 170 lb (77.1 kg)   LMP 02/24/2022 (Exact Date) Comment: Tubal  BMI 29.18 kg/m  CONSTITUTIONAL: Well-developed, well-nourished female in no acute distress.  HENT:  Normocephalic,  atraumatic, External right and left ear normal. Oropharynx is clear and moist EYES: Conjunctivae and EOM are normal.  NECK: Normal range of motion, supple, no masses.  Normal thyroid.  SKIN: Skin is warm and dry. No rash noted. Not diaphoretic. No erythema. No pallor. MUSCULOSKELETAL: Normal range of motion. No tenderness.  No cyanosis, clubbing, or edema.  2+ distal pulses. NEUROLOGIC: Alert and oriented to person, place, and time. Normal reflexes, muscle tone coordination.  PSYCHIATRIC: Normal mood and affect. Normal behavior. Normal judgment and thought content. CARDIOVASCULAR: Normal heart rate noted, regular rhythm RESPIRATORY: Clear to auscultation bilaterally. Effort  and breath sounds normal, no problems with respiration noted. BREASTS: Symmetric in size. No masses, tenderness, skin changes, nipple drainage, or lymphadenopathy bilaterally. Performed in the presence of a chaperone. ABDOMEN: Soft, no distention noted.  No tenderness, rebound or guarding.  PELVIC: Normal appearing external genitalia and urethral meatus; normal appearing vaginal mucosa and cervix.  No abnormal discharge noted.   Normal uterine size, no other palpable masses, no uterine or adnexal tenderness.  Performed in the presence of a chaperone.   Assessment and Plan:    1. Women's annual routine gynecological examination Normal annual exam, no obvious objective findings other than elevated blood pressure.  Pt states her BP is normal at home and it is always elevated at the doctor's office ie white coat syndrome.  2. Menorrhagia with regular cycle Will check  cbc today for anemia, pelvic u/s ordered - US PELVIC COMPLETE WITH TRANSVAGINAL; Future  3. Routine screening for STI (sexually transmitted infection) Per pt request  - Cervicovaginal ancillary only( [place]) - Hepatitis C Antibody - HIV antibody (with reflex) - RPR  Routine preventative health maintenance measures emphasized. Please refer to After Visit Summary for other counseling recommendations.    Will f/u in 6 weeks with virtual visit to discuss ultrasound findings. Have discussed oral TXA and progesterone IUD as potential initial treatment options.  Lynnda Shields, MD, [place] [facility], [place]

## 2022-03-24 NOTE — Progress Notes (Signed)
36 y.o GYN presents for AEX/PAP/STD screening.  C/o heavy periods, clots, pain 6/10 x 4 yrs.

## 2022-03-25 LAB — CBC
Hematocrit: 38.7 % (ref 34.0–46.6)
Hemoglobin: 12.9 g/dL (ref 11.1–15.9)
MCH: 26.4 pg — ABNORMAL LOW (ref 26.6–33.0)
MCHC: 33.3 g/dL (ref 31.5–35.7)
MCV: 79 fL (ref 79–97)
Platelets: 340 10*3/uL (ref 150–450)
RBC: 4.89 x10E6/uL (ref 3.77–5.28)
RDW: 13.6 % (ref 11.7–15.4)
WBC: 5.4 10*3/uL (ref 3.4–10.8)

## 2022-03-25 LAB — RPR: RPR Ser Ql: NONREACTIVE

## 2022-03-25 LAB — HEPATITIS C ANTIBODY: Hep C Virus Ab: NONREACTIVE

## 2022-03-25 LAB — HIV ANTIBODY (ROUTINE TESTING W REFLEX): HIV Screen 4th Generation wRfx: NONREACTIVE

## 2022-03-28 LAB — CERVICOVAGINAL ANCILLARY ONLY
Chlamydia: NEGATIVE
Comment: NEGATIVE
Comment: NEGATIVE
Comment: NORMAL
Neisseria Gonorrhea: NEGATIVE
Trichomonas: NEGATIVE

## 2022-04-04 ENCOUNTER — Telehealth: Payer: Self-pay

## 2022-04-05 ENCOUNTER — Ambulatory Visit (HOSPITAL_COMMUNITY): Admission: RE | Admit: 2022-04-05 | Payer: Commercial Managed Care - HMO | Source: Ambulatory Visit

## 2022-04-17 ENCOUNTER — Ambulatory Visit (HOSPITAL_COMMUNITY)
Admission: RE | Admit: 2022-04-17 | Discharge: 2022-04-17 | Disposition: A | Discharge status: Home / Self Care | Payer: Commercial Managed Care - HMO | Source: Ambulatory Visit | Attending: Obstetrics and Gynecology | Admitting: Obstetrics and Gynecology

## 2022-04-17 DIAGNOSIS — N920 Excessive and frequent menstruation with regular cycle: Secondary | ICD-10-CM | POA: Diagnosis present

## 2022-05-10 ENCOUNTER — Telehealth (INDEPENDENT_AMBULATORY_CARE_PROVIDER_SITE_OTHER): Payer: Commercial Managed Care - HMO | Admitting: Obstetrics and Gynecology

## 2022-05-10 DIAGNOSIS — N920 Excessive and frequent menstruation with regular cycle: Secondary | ICD-10-CM

## 2022-05-10 MED ORDER — TRANEXAMIC ACID 650 MG PO TABS: 1300.0000 mg | ORAL_TABLET | Freq: Three times a day (TID) | ORAL | 5 refills | Status: AC

## 2022-05-10 NOTE — Progress Notes (Signed)
Follow up AUB/ [place] results/ plan

## 2022-05-10 NOTE — Progress Notes (Signed)
GYNECOLOGY VIRTUAL VISIT ENCOUNTER NOTE  Provider location: [facility]   Patient location: Home  I connected with [name] on 05/10/22 at  4:10 PM EDT by MyChart Video Encounter and verified that I am speaking with the correct person using two identifiers.   I discussed the limitations, risks, security and privacy concerns of performing an evaluation and management service virtually and the availability of in person appointments. I also discussed with the patient that there may be a patient responsible charge related to this service. The patient expressed understanding and agreed to proceed.   History:  Anita Baldwin is a 36 y.o. G20P3003 female being evaluated today for ultrasound follow up for menorrhagia. She denies any abnormal vaginal discharge,  pelvic pain or other concerns.  We reviewed the ultrasound findings and discussed the first tier of treatment options.  We discussed OCP, lysteda and progesterone IUD.  Pluses and minuses discussed for each.  Pt desires trial of lysteda     Past Medical History:  Diagnosis Date   Medical history non-contributory    No pertinent past medical history    Past Surgical History:  Procedure Laterality Date   NO PAST SURGERIES     TUBAL LIGATION Bilateral 06/07/2018   Procedure: POST PARTUM TUBAL LIGATION;  Surgeon: Reva Bores, MD;  Location: [facility] BIRTHING SUITES;  Service: Gynecology;  Laterality: Bilateral;   The following portions of the patient's history were reviewed and updated as appropriate: allergies, current medications, past family history, past medical history, past social history, past surgical history and problem list.   Health Maintenance:  Normal pap and negative HRHPV on 05/31/20.    Review of Systems:  Pertinent items noted in HPI and remainder of comprehensive ROS otherwise negative.  Physical Exam:   General:  Alert, oriented and cooperative. Patient appears to be in no acute  distress.  Mental Status: Normal mood and affect. Normal behavior. Normal judgment and thought content.   Respiratory: Normal respiratory effort, no problems with respiration noted  Rest of physical exam deferred due to type of encounter  Labs and Imaging No results found for this or any previous visit (from the past 336 hour(s)). US PELVIC COMPLETE WITH TRANSVAGINAL  Result Date: 04/17/2022 CLINICAL DATA:  Menorrhagia EXAM: TRANSABDOMINAL AND TRANSVAGINAL ULTRASOUND OF PELVIS TECHNIQUE: Both transabdominal and transvaginal ultrasound examinations of the pelvis were performed. Transabdominal technique was performed for global imaging of the pelvis including uterus, ovaries, adnexal regions, and pelvic cul-de-sac. It was necessary to proceed with endovaginal exam following the transabdominal exam to visualize the endometrium. COMPARISON:  None Available. FINDINGS: Uterus Measurements: 8.3 x 5.1 x 6.9 cm = volume: 153.9 mL. No fibroids or other mass visualized. Endometrium Thickness: 18 mm.  No focal abnormality visualized. Right ovary Measurements: 3.3 x 1.6 x 1.9 cm = volume: 4.9 mL. Normal appearance/no adnexal mass. Left ovary Measurements: 3.8 x 2.4 x 2.7 cm = volume: 13.0 mL. Corpus luteum left ovary. Other findings Trace fluid. IMPRESSION: Endometrium measures 18 mm. If bleeding remains unresponsive to hormonal or medical therapy, focal lesion work-up with sonohysterogram should be considered. Endometrial biopsy should also be considered in pre-menopausal patients at high risk for endometrial carcinoma. (Ref: Radiological Reasoning: Algorithmic Workup of Abnormal Vaginal Bleeding with Endovaginal Sonography and Sonohysterography. AJR 2008; 259:D63-87) Electronically Signed   By: Annia Belt M.D.   On: 04/17/2022 15:03       Assessment and Plan:     1. Menorrhagia  with regular cycle Pt desires trial of lysteda.  Rx sent.  Will follow up in 3 months with virtual visit       I discussed the  assessment and treatment plan with the patient. The patient was provided an opportunity to ask questions and all were answered. The patient agreed with the plan and demonstrated an understanding of the instructions.   The patient was advised to call back or seek an in-person evaluation/go to the ED if the symptoms worsen or if the condition fails to improve as anticipated.  I provided 10 minutes of face-to-face time during this encounter.   Warden Fillers, MD [facility] for [employer], [facility]

## 2022-09-05 NOTE — Telephone Encounter (Signed)
ASK TO RESCH [place] FOR 6/7. PROVIDED PT WITH # TO FACILITY WHO WOULD DO [place]

## 2022-11-08 IMAGING — US US PELVIS COMPLETE WITH TRANSVAGINAL
1 series · 13 of 25 positions shown · non-contrast
Comparison: None Available.

CLINICAL DATA: Menorrhagia



[Series 1: us pelvic complete with transvaginal · 13 of 129 slices shown]
[im 1/129]
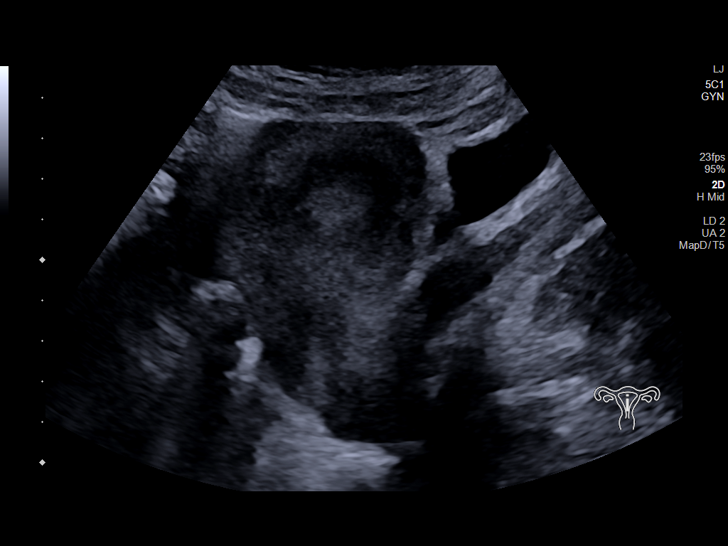
[im 11/129]
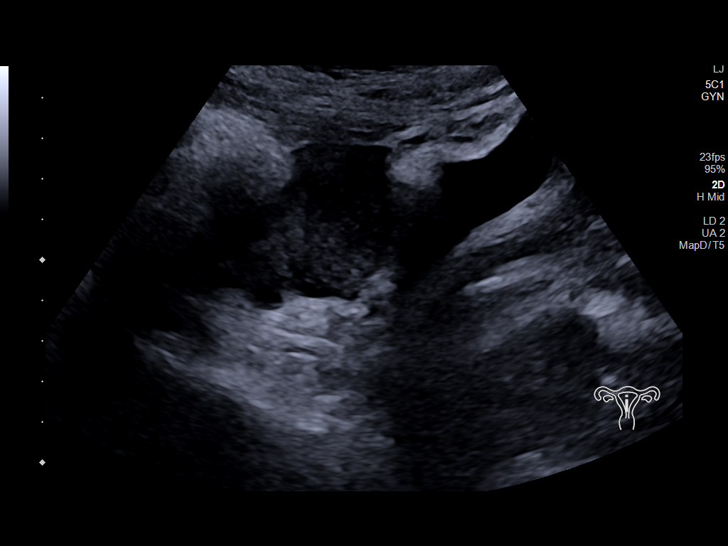
[im 22/129]
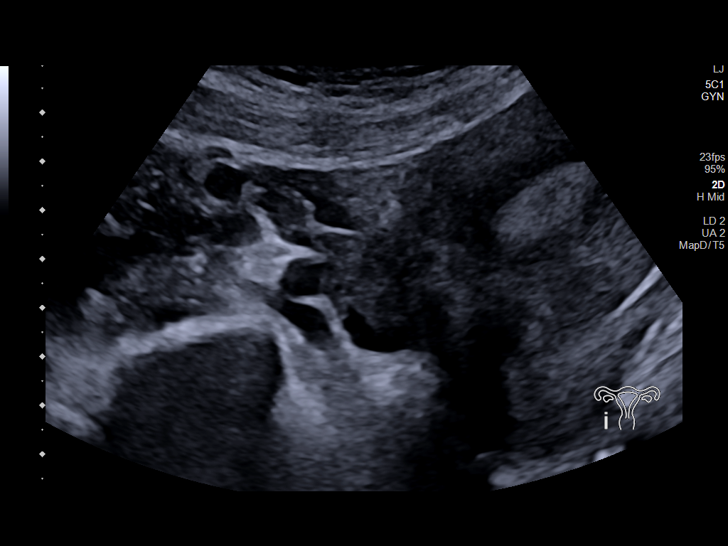
[im 33/129]
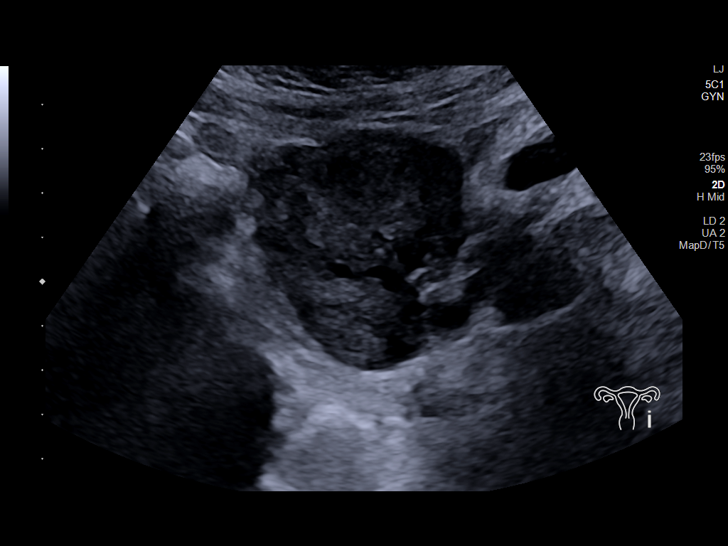
[im 43/129]
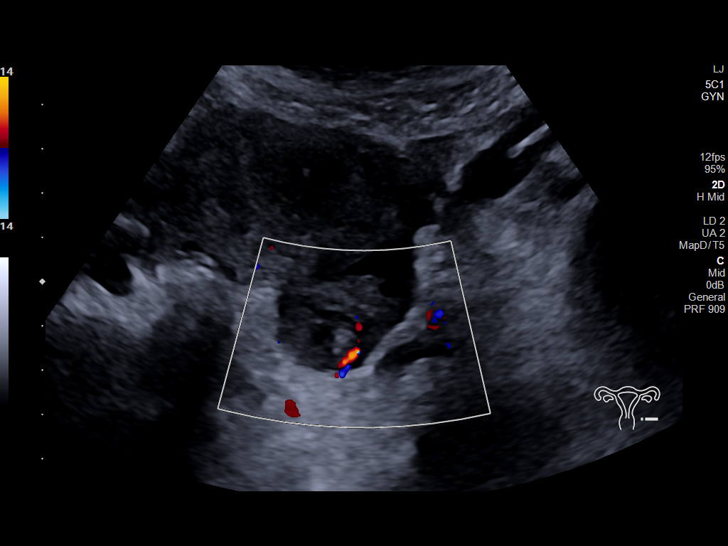
[im 54/129]
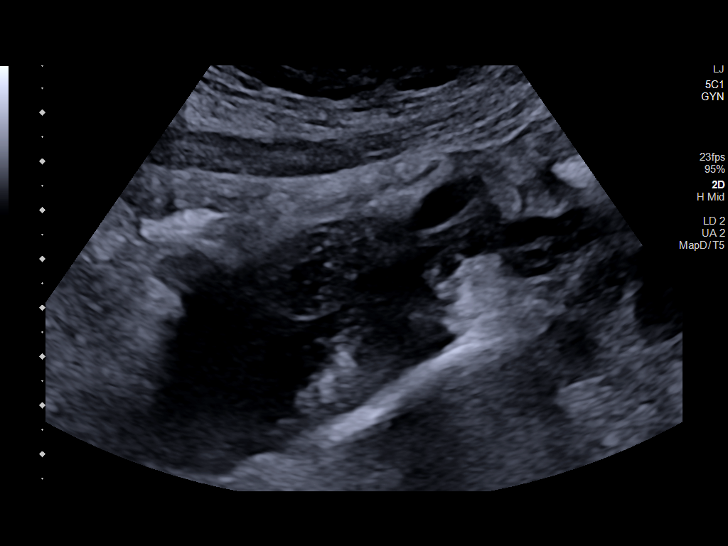
[im 65/129]
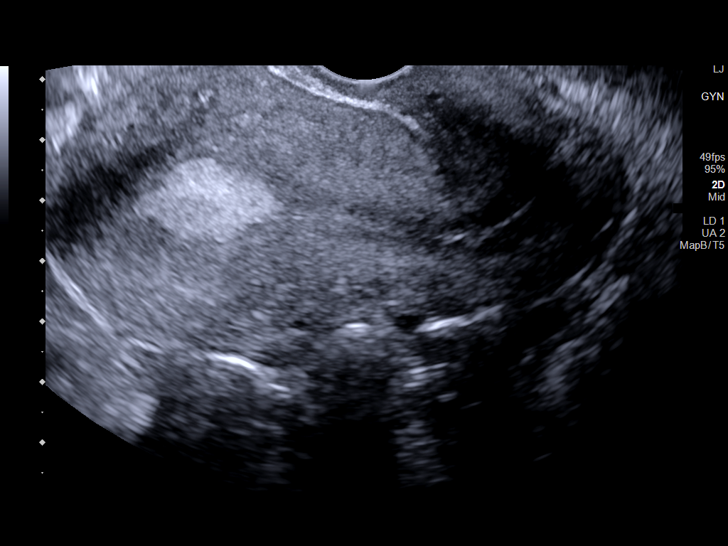
[im 75/129]
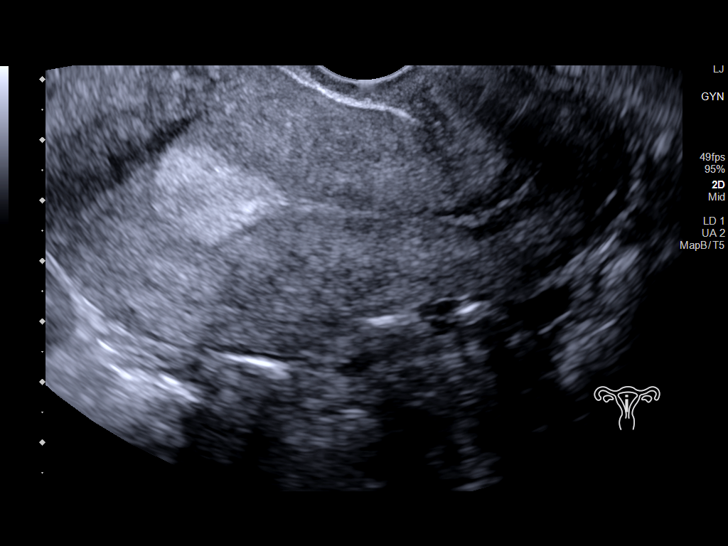
[im 86/129]
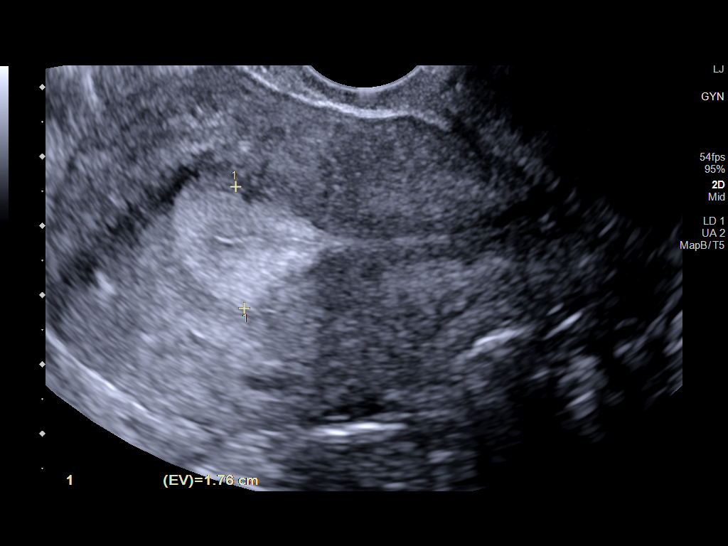
[im 97/129]
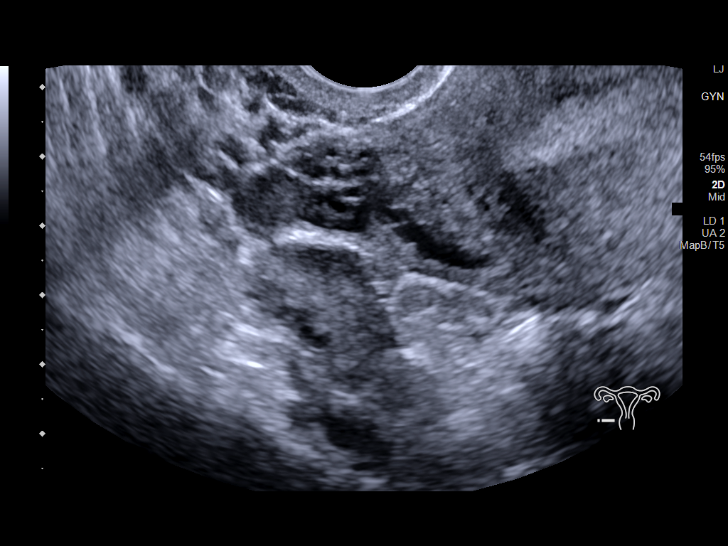
[im 107/129]
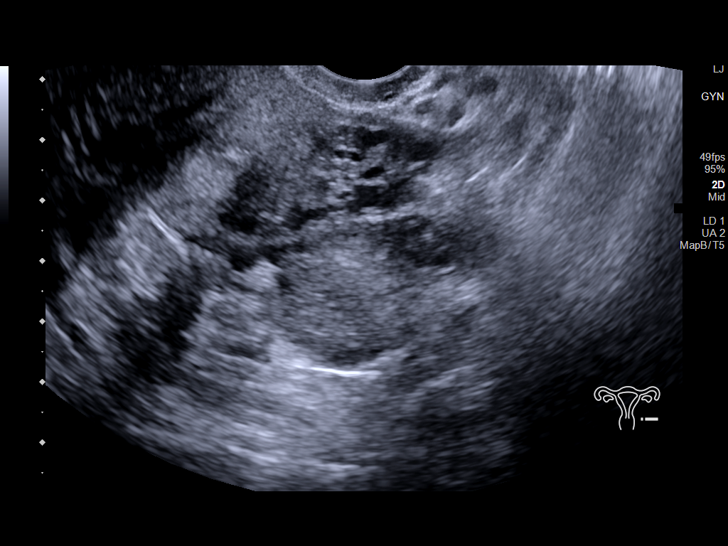
[im 118/129]
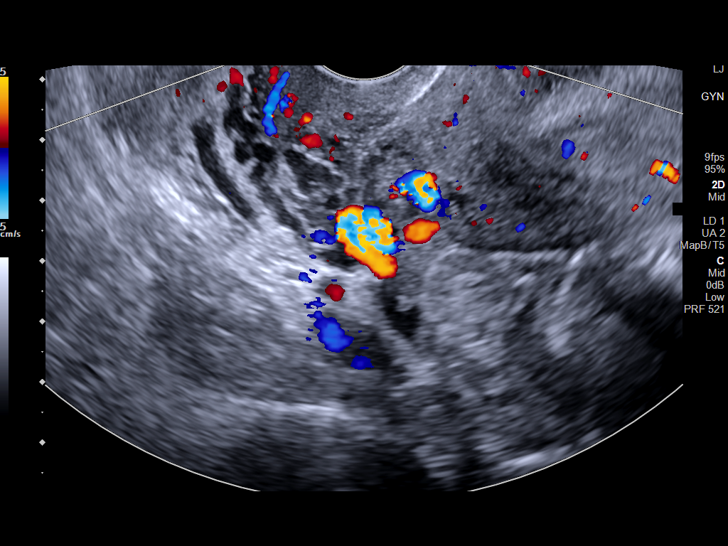
[im 129/129]
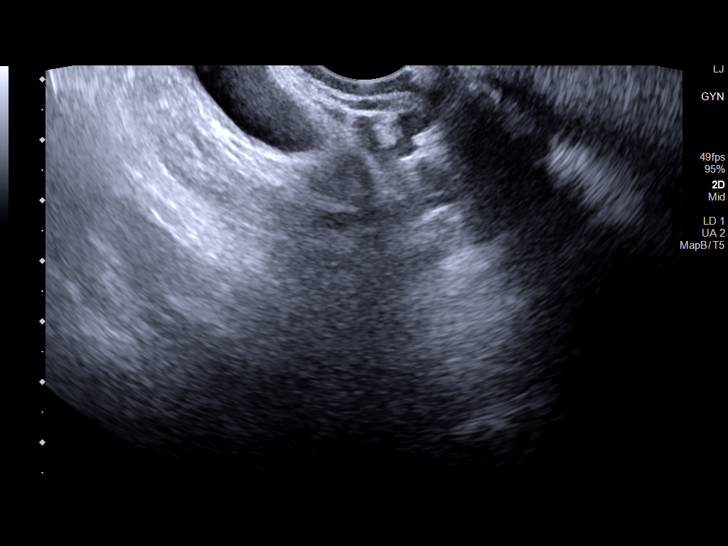

[13 of 25 positions shown; findings below may reference images not displayed]

FINDINGS: Uterus

Measurements: 8.3 x 5.1 x 6.9 cm = volume: 153.9 mL. No fibroids or
other mass visualized.

Endometrium

Thickness: 18 mm.  No focal abnormality visualized.

Right ovary

Measurements: 3.3 x 1.6 x 1.9 cm = volume: 4.9 mL. Normal
appearance/no adnexal mass.

Left ovary

Measurements: 3.8 x 2.4 x 2.7 cm = volume: 13.0 mL. Corpus luteum
left ovary.

Other findings

Trace fluid.
IMPRESSION: Endometrium measures 18 mm. If bleeding remains unresponsive to
hormonal or medical therapy, focal lesion work-up with
sonohysterogram should be considered. Endometrial biopsy should also
be considered in pre-menopausal patients at high risk for
endometrial carcinoma. (Ref: Radiological Reasoning: Algorithmic
Workup of Abnormal Vaginal Bleeding with Endovaginal Sonography and
Sonohysterography. [id number]; 191:S68-73)
